# Patient Record
Sex: Female | Born: 1990 | Race: Black or African American | Hispanic: No | Marital: Single | Smoking: Former smoker
Health system: Southern US, Community
[De-identification: ages and names within clinical notes are randomized; demographics above are authoritative.]

## PROBLEM LIST (undated history)

## (undated) ENCOUNTER — Inpatient Hospital Stay (HOSPITAL_COMMUNITY): Payer: Self-pay

## (undated) DIAGNOSIS — D509 Iron deficiency anemia, unspecified: Secondary | ICD-10-CM

## (undated) DIAGNOSIS — IMO0002 Reserved for concepts with insufficient information to code with codable children: Secondary | ICD-10-CM

## (undated) DIAGNOSIS — Z8742 Personal history of other diseases of the female genital tract: Secondary | ICD-10-CM

## (undated) DIAGNOSIS — J45909 Unspecified asthma, uncomplicated: Secondary | ICD-10-CM

## (undated) DIAGNOSIS — N390 Urinary tract infection, site not specified: Secondary | ICD-10-CM

## (undated) HISTORY — PX: HERNIA REPAIR: SHX51

---
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
## 2010-01-23 ENCOUNTER — Inpatient Hospital Stay (HOSPITAL_COMMUNITY): Admission: AD | Admit: 2010-01-23 | Discharge: 2010-01-23 | Payer: Self-pay | Admitting: Obstetrics and Gynecology

## 2010-04-09 ENCOUNTER — Inpatient Hospital Stay (HOSPITAL_COMMUNITY): Admission: AD | Admit: 2010-04-09 | Discharge: 2010-04-12 | Payer: Self-pay | Admitting: Obstetrics and Gynecology

## 2010-04-10 ENCOUNTER — Encounter (INDEPENDENT_AMBULATORY_CARE_PROVIDER_SITE_OTHER): Payer: Self-pay | Admitting: Obstetrics and Gynecology

## 2010-09-26 LAB — CBC
HCT: 37.4 % (ref 36.0–46.0)
Hemoglobin: 10.3 g/dL — ABNORMAL LOW (ref 12.0–15.0)
Hemoglobin: 12.3 g/dL (ref 12.0–15.0)
MCH: 27.1 pg (ref 26.0–34.0)
MCH: 27.5 pg (ref 26.0–34.0)
MCHC: 32.9 g/dL (ref 30.0–36.0)
MCV: 82.6 fL (ref 78.0–100.0)
Platelets: 168 10*3/uL (ref 150–400)
RBC: 3.76 MIL/uL — ABNORMAL LOW (ref 3.87–5.11)
RBC: 4.53 MIL/uL (ref 3.87–5.11)
RDW: 15.6 % — ABNORMAL HIGH (ref 11.5–15.5)
WBC: 13 10*3/uL — ABNORMAL HIGH (ref 4.0–10.5)

## 2010-09-29 LAB — URINE MICROSCOPIC-ADD ON

## 2010-09-29 LAB — URINALYSIS, ROUTINE W REFLEX MICROSCOPIC
Glucose, UA: NEGATIVE mg/dL
Ketones, ur: NEGATIVE mg/dL
pH: 6 (ref 5.0–8.0)

## 2010-09-29 LAB — WET PREP, GENITAL

## 2010-09-29 LAB — FETAL FIBRONECTIN: Fetal Fibronectin: NEGATIVE

## 2010-09-29 LAB — URINE CULTURE

## 2010-10-02 LAB — POCT PREGNANCY, URINE: Preg Test, Ur: POSITIVE

## 2010-10-02 LAB — URINALYSIS, ROUTINE W REFLEX MICROSCOPIC
Glucose, UA: NEGATIVE mg/dL
Ketones, ur: 40 mg/dL — AB
Protein, ur: NEGATIVE mg/dL

## 2010-10-02 LAB — URINE CULTURE: Colony Count: >=100000

## 2010-10-02 LAB — URINE MICROSCOPIC-ADD ON

## 2010-10-27 ENCOUNTER — Emergency Department (HOSPITAL_COMMUNITY)
Admission: EM | Admit: 2010-10-27 | Discharge: 2010-10-27 | Disposition: A | Discharge status: Home / Self Care | Payer: Medicaid Other | Attending: Emergency Medicine | Admitting: Emergency Medicine

## 2010-10-27 DIAGNOSIS — J45909 Unspecified asthma, uncomplicated: Secondary | ICD-10-CM | POA: Insufficient documentation

## 2010-10-27 DIAGNOSIS — IMO0002 Reserved for concepts with insufficient information to code with codable children: Secondary | ICD-10-CM | POA: Insufficient documentation

## 2010-10-27 DIAGNOSIS — X58XXXA Exposure to other specified factors, initial encounter: Secondary | ICD-10-CM | POA: Insufficient documentation

## 2010-10-27 DIAGNOSIS — R4182 Altered mental status, unspecified: Secondary | ICD-10-CM | POA: Insufficient documentation

## 2010-10-27 DIAGNOSIS — F1010 Alcohol abuse, uncomplicated: Secondary | ICD-10-CM | POA: Insufficient documentation

## 2010-10-27 LAB — POCT I-STAT, CHEM 8
Chloride: 109 mEq/L (ref 96–112)
Creatinine, Ser: 0.9 mg/dL (ref 0.4–1.2)
Glucose, Bld: 110 mg/dL — ABNORMAL HIGH (ref 70–99)
HCT: 47 % — ABNORMAL HIGH (ref 36.0–46.0)
Potassium: 3.4 mEq/L — ABNORMAL LOW (ref 3.5–5.1)

## 2011-04-25 ENCOUNTER — Emergency Department (HOSPITAL_COMMUNITY)
Admission: EM | Admit: 2011-04-25 | Discharge: 2011-04-25 | Disposition: A | Discharge status: Home / Self Care | Payer: Medicaid Other | Attending: Emergency Medicine | Admitting: Emergency Medicine

## 2011-04-25 DIAGNOSIS — J45909 Unspecified asthma, uncomplicated: Secondary | ICD-10-CM | POA: Insufficient documentation

## 2011-04-25 DIAGNOSIS — IMO0002 Reserved for concepts with insufficient information to code with codable children: Secondary | ICD-10-CM | POA: Insufficient documentation

## 2011-04-27 LAB — CULTURE, ROUTINE-ABSCESS

## 2011-06-15 ENCOUNTER — Emergency Department (HOSPITAL_COMMUNITY)
Admission: EM | Admit: 2011-06-15 | Discharge: 2011-06-16 | Disposition: A | Discharge status: Home / Self Care | Payer: Medicaid Other | Attending: Emergency Medicine | Admitting: Emergency Medicine

## 2011-06-15 DIAGNOSIS — N390 Urinary tract infection, site not specified: Secondary | ICD-10-CM | POA: Insufficient documentation

## 2011-06-15 DIAGNOSIS — J111 Influenza due to unidentified influenza virus with other respiratory manifestations: Secondary | ICD-10-CM | POA: Insufficient documentation

## 2011-06-15 NOTE — ED Notes (Signed)
Pt reports that she has been having body aches, fever, nausea and vomiting starting today, reports that she thinks she may be pregnant

## 2011-06-16 LAB — URINALYSIS, ROUTINE W REFLEX MICROSCOPIC
Glucose, UA: NEGATIVE mg/dL
Hgb urine dipstick: NEGATIVE
Ketones, ur: 15 mg/dL — AB
Nitrite: NEGATIVE
pH: 6.5 (ref 5.0–8.0)

## 2011-06-16 LAB — PREGNANCY, URINE: Preg Test, Ur: NEGATIVE

## 2011-06-16 LAB — URINE MICROSCOPIC-ADD ON

## 2011-06-16 MED ORDER — SODIUM CHLORIDE 0.9 % IV BOLUS (SEPSIS)
1000.0000 mL | Freq: Once | INTRAVENOUS | Status: AC
  Administered 2011-06-16: 1000 mL via INTRAVENOUS

## 2011-06-16 MED ORDER — ACETAMINOPHEN 325 MG PO TABS
ORAL_TABLET | ORAL | Status: AC
  Administered 2011-06-16: 650 mg
  Filled 2011-06-16: qty 2

## 2011-06-16 MED ORDER — ONDANSETRON HCL 4 MG/2ML IJ SOLN
4.0000 mg | Freq: Once | INTRAMUSCULAR | Status: AC
  Administered 2011-06-16: 4 mg via INTRAVENOUS
  Filled 2011-06-16: qty 2

## 2011-06-16 MED ORDER — ONDANSETRON HCL 4 MG PO TABS: 4.0000 mg | ORAL_TABLET | Freq: Four times a day (QID) | ORAL | Status: AC

## 2011-06-16 MED ORDER — NITROFURANTOIN MONOHYD MACRO 100 MG PO CAPS: 100.0000 mg | ORAL_CAPSULE | Freq: Two times a day (BID) | ORAL | Status: AC

## 2011-06-16 MED ORDER — OSELTAMIVIR PHOSPHATE 75 MG PO CAPS: 75.0000 mg | ORAL_CAPSULE | Freq: Two times a day (BID) | ORAL | Status: AC

## 2011-06-16 NOTE — ED Provider Notes (Signed)
History     CSN: [id number] Arrival date & time: 06/15/2011 10:10 PM   First MD Initiated Contact with Patient 06/16/11 [phone]      Chief Complaint  Patient presents with  . Nausea    (Consider location/radiation/quality/duration/timing/severity/associated sxs/prior treatment) HPI Comments: Patient here with a day history of fever, chills, nausea without vomiting, headache, runny nose, and generalized body aches - she reports that she has had the flu shot this year already - was concerned she may be pregnant.  Patient is a 20 y.o. female presenting with fever. The history is provided by the patient. No language interpreter was used.  Fever Primary symptoms of the febrile illness include fever, fatigue, headaches, cough, nausea and myalgias. Primary symptoms do not include wheezing, shortness of breath, abdominal pain, vomiting, diarrhea, dysuria, arthralgias or rash. The current episode started yesterday. This is a new problem. The problem has not changed since onset.   Past Medical History  Diagnosis Date  . Arthritis     Past Surgical History  Procedure Date  . Hernia repair     No family history on file.  History  Substance Use Topics  . Smoking status: Former Smoker    Types: Cigarettes    Quit date: 06/14/2008  . Smokeless tobacco: Not on file  . Alcohol Use: No    OB History    Grav Para Term Preterm Abortions TAB SAB Ect Mult Living   1 1 1       1       Review of Systems  Constitutional: Positive for fever and fatigue.  Respiratory: Positive for cough. Negative for shortness of breath and wheezing.   Gastrointestinal: Positive for nausea. Negative for vomiting, abdominal pain and diarrhea.  Genitourinary: Negative for dysuria.  Musculoskeletal: Positive for myalgias. Negative for arthralgias.  Skin: Negative for rash.  Neurological: Positive for headaches.  All other systems reviewed and are negative.    Allergies  Review of patient's allergies  indicates no known allergies.  Home Medications   Current Outpatient Rx  Name Route Sig Dispense Refill  . IBUPROFEN 200 MG PO TABS Oral Take 200 mg by mouth every 6 (six) hours as needed. Cramps       BP 123/79  Pulse 102  Temp(Src) 101.6 F (38.7 C) (Oral)  Resp 20  Wt 141 lb 14.4 oz (64.365 kg)  SpO2 98%  LMP 05/16/2011  Breastfeeding? No  Physical Exam  Nursing note and vitals reviewed. Constitutional: She is oriented to person, place, and time. She appears well-developed and well-nourished. No distress.  HENT:  Head: Normocephalic and atraumatic.  Right Ear: External ear normal.  Left Ear: External ear normal.  Nose: Rhinorrhea present.  Mouth/Throat: Oropharynx is clear and moist. No oropharyngeal exudate.  Eyes: Conjunctivae are normal. Pupils are equal, round, and reactive to light.  Neck: Normal range of motion. Neck supple.  Cardiovascular: Normal rate, regular rhythm and normal heart sounds.  Exam reveals no gallop and no friction rub.   No murmur heard. Pulmonary/Chest: Effort normal and breath sounds normal. No respiratory distress. She has no wheezes. She has no rales.  Abdominal: Soft. Bowel sounds are normal. There is no tenderness.  Musculoskeletal: Normal range of motion.  Lymphadenopathy:    She has no cervical adenopathy.  Neurological: She is alert and oriented to person, place, and time.  Skin: Skin is warm and dry. No rash noted.  Psychiatric: She has a normal mood and affect. Her behavior is normal. Judgment and thought  content normal.    ED Course  Procedures (including critical care time)  Labs Reviewed  URINALYSIS, ROUTINE W REFLEX MICROSCOPIC - Abnormal; Notable for the following:    Color, Urine AMBER (*) BIOCHEMICALS MAY BE AFFECTED BY COLOR   APPearance CLOUDY (*)    Bilirubin Urine SMALL (*)    Ketones, ur 15 (*)    Protein, ur 30 (*)    All other components within normal limits  URINE MICROSCOPIC-ADD ON - Abnormal; Notable for the  following:    Squamous Epithelial / LPF FEW (*)    Bacteria, UA FEW (*)    All other components within normal limits  PREGNANCY, URINE  PREGNANCY, URINE   No results found.   UTI Influenza   MDM  Patient reports feeling better after a liter of fluids and zofran - not pregnant but with UTI and likely the flu as well.  Will start on tamiflu and give zofran - she is encouraged to take tylenol and motrin for the fever and chills.        Izola Price [place], [place] 06/16/11 [phone]

## 2011-06-16 NOTE — ED Notes (Signed)
Tolerated meal given

## 2011-06-17 NOTE — ED Provider Notes (Signed)
Medical screening examination/treatment/procedure(s) were performed by non-physician practitioner and as supervising physician I was immediately available for consultation/collaboration.  [name] R. [name], MD 06/17/11 1948 

## 2012-05-25 ENCOUNTER — Encounter (HOSPITAL_COMMUNITY): Payer: Self-pay | Admitting: Emergency Medicine

## 2012-05-25 ENCOUNTER — Emergency Department (HOSPITAL_COMMUNITY): Payer: Medicaid Other

## 2012-05-25 ENCOUNTER — Emergency Department (HOSPITAL_COMMUNITY)
Admission: EM | Admit: 2012-05-25 | Discharge: 2012-05-25 | Disposition: A | Discharge status: Home / Self Care | Payer: Medicaid Other | Attending: Emergency Medicine | Admitting: Emergency Medicine

## 2012-05-25 DIAGNOSIS — R059 Cough, unspecified: Secondary | ICD-10-CM | POA: Insufficient documentation

## 2012-05-25 DIAGNOSIS — R5381 Other malaise: Secondary | ICD-10-CM | POA: Insufficient documentation

## 2012-05-25 DIAGNOSIS — Z8739 Personal history of other diseases of the musculoskeletal system and connective tissue: Secondary | ICD-10-CM | POA: Insufficient documentation

## 2012-05-25 DIAGNOSIS — J45901 Unspecified asthma with (acute) exacerbation: Secondary | ICD-10-CM | POA: Insufficient documentation

## 2012-05-25 DIAGNOSIS — R52 Pain, unspecified: Secondary | ICD-10-CM | POA: Insufficient documentation

## 2012-05-25 DIAGNOSIS — J069 Acute upper respiratory infection, unspecified: Secondary | ICD-10-CM | POA: Insufficient documentation

## 2012-05-25 DIAGNOSIS — R509 Fever, unspecified: Secondary | ICD-10-CM | POA: Insufficient documentation

## 2012-05-25 DIAGNOSIS — R071 Chest pain on breathing: Secondary | ICD-10-CM | POA: Insufficient documentation

## 2012-05-25 DIAGNOSIS — R51 Headache: Secondary | ICD-10-CM | POA: Insufficient documentation

## 2012-05-25 DIAGNOSIS — R05 Cough: Secondary | ICD-10-CM | POA: Insufficient documentation

## 2012-05-25 DIAGNOSIS — J3489 Other specified disorders of nose and nasal sinuses: Secondary | ICD-10-CM | POA: Insufficient documentation

## 2012-05-25 DIAGNOSIS — J111 Influenza due to unidentified influenza virus with other respiratory manifestations: Secondary | ICD-10-CM

## 2012-05-25 DIAGNOSIS — Z87891 Personal history of nicotine dependence: Secondary | ICD-10-CM | POA: Insufficient documentation

## 2012-05-25 LAB — RAPID STREP SCREEN (MED CTR MEBANE ONLY): Streptococcus, Group A Screen (Direct): NEGATIVE

## 2012-05-25 MED ORDER — PREDNISONE 20 MG PO TABS: 40.0000 mg | ORAL_TABLET | Freq: Every day | ORAL | Status: DC

## 2012-05-25 MED ORDER — ALBUTEROL SULFATE HFA 108 (90 BASE) MCG/ACT IN AERS: 1.0000 | INHALATION_SPRAY | Freq: Four times a day (QID) | RESPIRATORY_TRACT | Status: DC | PRN

## 2012-05-25 MED ORDER — PREDNISONE 20 MG PO TABS
40.0000 mg | ORAL_TABLET | Freq: Once | ORAL | Status: AC
  Administered 2012-05-25: 40 mg via ORAL
  Filled 2012-05-25: qty 2

## 2012-05-25 MED ORDER — ALBUTEROL SULFATE (5 MG/ML) 0.5% IN NEBU: 5.0000 mg | INHALATION_SOLUTION | Freq: Once | RESPIRATORY_TRACT | Status: DC

## 2012-05-25 MED ORDER — ALBUTEROL SULFATE (5 MG/ML) 0.5% IN NEBU
5.0000 mg | INHALATION_SOLUTION | Freq: Once | RESPIRATORY_TRACT | Status: AC
  Administered 2012-05-25: 5 mg via RESPIRATORY_TRACT
  Filled 2012-05-25: qty 1

## 2012-05-25 MED ORDER — IPRATROPIUM BROMIDE 0.02 % IN SOLN
0.5000 mg | Freq: Once | RESPIRATORY_TRACT | Status: AC
  Administered 2012-05-25: 0.5 mg via RESPIRATORY_TRACT
  Filled 2012-05-25: qty 2.5

## 2012-05-25 NOTE — ED Notes (Signed)
Pt c/o URI sx with productive cough and green sputum and pain with cough; pt sts generalized body aches and sore throat

## 2012-05-25 NOTE — ED Provider Notes (Signed)
History     CSN: [id number]  Arrival date & time 05/25/12  1350   First MD Initiated Contact with Patient 05/25/12 1520      Chief Complaint  Patient presents with  . Sore Throat  . URI  . Generalized Body Aches    (Consider location/radiation/quality/duration/timing/severity/associated sxs/prior treatment) HPI Comments: Patient presents today with a chief complaint of generalized body aches, productive cough, ST, headache, decreased appetite and fatigue.  She reports that her symptoms have been present for the past four days and are gradually worsening.  She did have a fever of 101 orally two days ago, but no fever since then.  She has taken Tylenol for her symptoms, which she reports does help.  She has not taken any Tylenol or Ibuprofen since yesterday.  She did not have the flu vaccine this year.  PMH significant for Asthma.  She ran out of her Albuterol inhaler.  She states that she has had some wheezing and tightness in her chest over the past couple of days.  She currently does not smoke.  She quit in 2009.    The history is provided by the patient.    Past Medical History  Diagnosis Date  . Arthritis     Past Surgical History  Procedure Date  . Hernia repair     History reviewed. No pertinent family history.  History  Substance Use Topics  . Smoking status: Former Smoker    Types: Cigarettes    Quit date: 06/14/2008  . Smokeless tobacco: Not on file  . Alcohol Use: No    OB History    Grav Para Term Preterm Abortions TAB SAB Ect Mult Living   1 1 1       1       Review of Systems  Constitutional: Positive for fever, chills, appetite change and fatigue.  HENT: Positive for congestion and sore throat. Negative for ear pain, rhinorrhea, drooling, trouble swallowing, neck pain, neck stiffness, voice change and sinus pressure.   Respiratory: Positive for cough, chest tightness and wheezing.   Gastrointestinal: Negative for nausea, vomiting, abdominal pain and  diarrhea.  Genitourinary: Negative for dysuria, urgency and frequency.  Skin: Negative for rash.  Neurological: Positive for headaches.    Allergies  Review of patient's allergies indicates no known allergies.  Home Medications   Current Outpatient Rx  Name  Route  Sig  Dispense  Refill  . PSEUDOEPH-DOXYLAMINE-DM-APAP 60-7.12-10-998 MG/30ML PO LIQD   Oral   Take 30 mLs by mouth 2 (two) times daily as needed. Cold and congestion           BP 134/88  Pulse 79  Temp 98.2 F (36.8 C) (Oral)  Resp 20  SpO2 97%  LMP 04/28/2012  Physical Exam  Nursing note and vitals reviewed. Constitutional: She appears well-developed and well-nourished.  HENT:  Head: Normocephalic and atraumatic. No trismus in the jaw.  Right Ear: Tympanic membrane and ear canal normal.  Left Ear: Tympanic membrane and ear canal normal.  Nose: Mucosal edema present. No rhinorrhea. Right sinus exhibits no maxillary sinus tenderness and no frontal sinus tenderness. Left sinus exhibits no maxillary sinus tenderness and no frontal sinus tenderness.  Mouth/Throat: Uvula is midline and mucous membranes are normal. No uvula swelling. Posterior oropharyngeal erythema present. No oropharyngeal exudate.  Neck: Normal range of motion. Neck supple.  Cardiovascular: Normal rate, regular rhythm and normal heart sounds.   Pulmonary/Chest: No accessory muscle usage. Not tachypneic. She has decreased breath sounds. She  has wheezes. She exhibits tenderness.       Patient able to speak in full sentences Patient with diffuse expiratory and inspiratory wheezes.  Neurological: She is alert.  Skin: Skin is warm and dry. No rash noted.  Psychiatric: She has a normal mood and affect.    ED Course  Procedures (including critical care time)   Labs Reviewed  RAPID STREP SCREEN   Dg Chest 2 View  05/25/2012  *RADIOLOGY REPORT*  Clinical Data: Cough and chest tightness  CHEST - 2 VIEW  Comparison: February 29, 2008  Findings:   Lungs clear.  Heart size and pulmonary vascularity are normal.  No adenopathy.  No bone lesions.  IMPRESSION: Lungs clear.   Original Report Authenticated By: Bretta Bang, M.D.      No diagnosis found.  4:14 PM Reassessed patient.  Lungs sounds have improved.  However, patient feels that she would benefit from one more breathing treatment.  4:54 PM Patient states that she no longer feels that she needs the second breathing treatment.  Lungs CTAB. MDM  Patient with symptoms consistent with influenza.  Vitals are stable.  No signs of dehydration, tolerating PO's.  CXR and rapid strep negative.  Discussed the cost versus benefit of Tamiflu treatment with the patient.  The patient understands that symptoms are greater than the recommended 24-48 hour window of treatment.  Patient will be discharged with instructions to orally hydrate, rest, and use over-the-counter medications such as anti-inflammatories ibuprofen and Aleve for muscle aches and Tylenol for fever.  Patient with a history of Asthma also complaining of wheezing.  She does not have an inhaler at home.  On exam initially she did have some expiratory wheezing.  Wheezing improved after one breathing treatment.  Patient also discharged home with Albuterol inhaler.          Pascal Lux Dandridge, PA-C 05/26/12 [phone]

## 2012-05-26 NOTE — ED Provider Notes (Signed)
Medical screening examination/treatment/procedure(s) were performed by non-physician practitioner and as supervising physician I was immediately available for consultation/collaboration.   Laray Anger, DO 05/26/12 1400

## 2012-07-14 NOTE — L&D Delivery Note (Signed)
Delivery Note At 5:05 AM a viable and healthy female was delivered via Vaginal, Spontaneous Delivery (Presentation: ; Occiput Anterior).  APGAR: 8, 9; weight P.   Placenta status: Intact, Spontaneous.  Cord: 3 vessels with the following complications: None.    Anesthesia: Epidural  Episiotomy: None Lacerations: None Suture Repair: N/A Est. Blood Loss (mL):   Mom to postpartum.  Baby to Couplet care / Skin to Skin.  BOVARD,Martavious Hartel 06/06/2013, 5:34 AM  Bo/A+/Nexplanon

## 2012-09-23 ENCOUNTER — Encounter (HOSPITAL_COMMUNITY): Payer: Self-pay | Admitting: Emergency Medicine

## 2012-09-23 ENCOUNTER — Emergency Department (HOSPITAL_COMMUNITY)
Admission: EM | Admit: 2012-09-23 | Discharge: 2012-09-23 | Disposition: A | Discharge status: Home / Self Care | Payer: Medicaid Other | Attending: Emergency Medicine | Admitting: Emergency Medicine

## 2012-09-23 DIAGNOSIS — Z87891 Personal history of nicotine dependence: Secondary | ICD-10-CM | POA: Insufficient documentation

## 2012-09-23 DIAGNOSIS — R300 Dysuria: Secondary | ICD-10-CM | POA: Insufficient documentation

## 2012-09-23 DIAGNOSIS — Z8739 Personal history of other diseases of the musculoskeletal system and connective tissue: Secondary | ICD-10-CM | POA: Insufficient documentation

## 2012-09-23 DIAGNOSIS — B3731 Acute candidiasis of vulva and vagina: Secondary | ICD-10-CM | POA: Insufficient documentation

## 2012-09-23 DIAGNOSIS — Z3202 Encounter for pregnancy test, result negative: Secondary | ICD-10-CM | POA: Insufficient documentation

## 2012-09-23 DIAGNOSIS — J45909 Unspecified asthma, uncomplicated: Secondary | ICD-10-CM | POA: Insufficient documentation

## 2012-09-23 DIAGNOSIS — B373 Candidiasis of vulva and vagina: Secondary | ICD-10-CM | POA: Insufficient documentation

## 2012-09-23 DIAGNOSIS — Z79899 Other long term (current) drug therapy: Secondary | ICD-10-CM | POA: Insufficient documentation

## 2012-09-23 HISTORY — DX: Unspecified asthma, uncomplicated: J45.909

## 2012-09-23 LAB — URINALYSIS, ROUTINE W REFLEX MICROSCOPIC
Glucose, UA: NEGATIVE mg/dL
Ketones, ur: NEGATIVE mg/dL
Nitrite: NEGATIVE
Protein, ur: NEGATIVE mg/dL
Urobilinogen, UA: 1 mg/dL (ref 0.0–1.0)

## 2012-09-23 LAB — POCT PREGNANCY, URINE: Preg Test, Ur: NEGATIVE

## 2012-09-23 MED ORDER — NYSTATIN 100000 UNIT/GM EX CREA: TOPICAL_CREAM | CUTANEOUS | Status: DC

## 2012-09-23 MED ORDER — FLUCONAZOLE 100 MG PO TABS
150.0000 mg | ORAL_TABLET | Freq: Once | ORAL | Status: AC
  Administered 2012-09-23: 150 mg via ORAL
  Filled 2012-09-23: qty 2

## 2012-09-23 MED ORDER — CEPHALEXIN 500 MG PO CAPS: 500.0000 mg | ORAL_CAPSULE | Freq: Four times a day (QID) | ORAL | Status: DC

## 2012-09-23 NOTE — ED Notes (Signed)
PT. REPORTS VAGINAL ITCHING Gl[place] Herring " BURNING" WITH DISCHARGE FOR SEVERAL DAYS .

## 2012-09-23 NOTE — ED Notes (Addendum)
Pt states that for the past 3-4 days she has been experiencing vaginal itching.  Pt states that symptoms started after she bought new panties from [place].  Pt states that has been a "pus like" discharge. Last menstrual cycle was 2/2.  Last intercourse around a month ago.

## 2012-09-23 NOTE — ED Provider Notes (Signed)
History     CSN: [id number]  Arrival date & time 09/23/12  0141   First MD Initiated Contact with Patient 09/23/12 0448      Chief Complaint  Patient presents with  . Vaginal Itching    (Consider location/radiation/quality/duration/timing/severity/associated sxs/prior treatment) HPI Hx per PT - vaginal itching and burning  X 2 days, is not diabetic, no ABD pain, c/o associated dysuria, no back pain, fevers, or N/V.  No rash or lesions or swelling. No h/o sign. Mild to Mod in severity.  Past Medical History  Diagnosis Date  . Arthritis   . Asthma     Past Surgical History  Procedure Laterality Date  . Hernia repair      No family history on file.  History  Substance Use Topics  . Smoking status: Former Smoker    Types: Cigarettes    Quit date: 06/14/2008  . Smokeless tobacco: Not on file  . Alcohol Use: No    OB History   Grav Para Term Preterm Abortions TAB SAB Ect Mult Living   1 1 1       1       Review of Systems  Constitutional: Negative for fever and chills.  HENT: Negative for neck pain and neck stiffness.   Eyes: Negative for pain.  Respiratory: Negative for shortness of breath.   Cardiovascular: Negative for chest pain.  Gastrointestinal: Negative for abdominal pain.  Genitourinary: Positive for dysuria. Negative for hematuria, flank pain, vaginal bleeding, vaginal discharge, difficulty urinating, genital sores and pelvic pain.  Musculoskeletal: Negative for back pain.  Skin: Negative for rash.  Neurological: Negative for headaches.  All other systems reviewed and are negative.    Allergies  Review of patient's allergies indicates no known allergies.  Home Medications   Current Outpatient Rx  Name  Route  Sig  Dispense  Refill  . albuterol (PROVENTIL HFA;VENTOLIN HFA) 108 (90 BASE) MCG/ACT inhaler   Inhalation   Inhale 1-2 puffs into the lungs every 6 (six) hours as needed for wheezing.   1 Inhaler   0   . cephALEXin (KEFLEX) 500 MG  capsule   Oral   Take 1 capsule (500 mg total) by mouth 4 (four) times daily.   20 capsule   0   . nystatin cream (MYCOSTATIN)      Apply to affected area 2 times daily   15 g   0   . predniSONE (DELTASONE) 20 MG tablet   Oral   Take 2 tablets (40 mg total) by mouth daily.   8 tablet   0   . Pseudoeph-Doxylamine-DM-APAP (NYQUIL) 60-7.12-10-998 MG/30ML LIQD   Oral   Take 30 mLs by mouth 2 (two) times daily as needed. Cold and congestion           BP 117/82  Pulse 75  Temp(Src) 98 F (36.7 C) (Oral)  Resp 14  SpO2 98%  LMP 08/15/2012  Physical Exam  Constitutional: She is oriented to person, place, and time. She appears well-developed and well-nourished.  HENT:  Head: Normocephalic and atraumatic.  Mouth/Throat: Oropharynx is clear and moist. No oropharyngeal exudate.  Eyes: EOM are normal. Pupils are equal, round, and reactive to light.  Neck: Neck supple.  Cardiovascular: Regular rhythm and intact distal pulses.   Pulmonary/Chest: Effort normal and breath sounds normal. No respiratory distress.  Abdominal: Soft. Bowel sounds are normal. She exhibits no distension.  No CVAT  Genitourinary:  Ext GU WNL - no swelling, erythema or rash/ lesions  Musculoskeletal: Normal range of motion. She exhibits no edema.  Neurological: She is alert and oriented to person, place, and time.  Skin: Skin is warm and dry. No rash noted.    ED Course  Procedures (including critical care time)   1. Yeast infection of the vagina     MDM  Clinical yeast infection - no herpetic lesions.  No pelvic pain, discharge or rash.    Ua reviewed and U Cx pending.  Referral to Health dept for f/u - no PCP        Sunnie Nielsen, MD 09/23/12 [phone]

## 2012-09-24 LAB — URINE CULTURE
Colony Count: NO GROWTH
Culture: NO GROWTH

## 2012-10-08 ENCOUNTER — Encounter (HOSPITAL_COMMUNITY): Payer: Self-pay | Admitting: Family

## 2012-10-08 ENCOUNTER — Inpatient Hospital Stay (HOSPITAL_COMMUNITY)
Admission: AD | Admit: 2012-10-08 | Discharge: 2012-10-08 | Disposition: A | Discharge status: Home / Self Care | Payer: Medicaid Other | Source: Ambulatory Visit | Attending: Obstetrics & Gynecology | Admitting: Obstetrics & Gynecology

## 2012-10-08 DIAGNOSIS — O210 Mild hyperemesis gravidarum: Secondary | ICD-10-CM

## 2012-10-08 LAB — URINALYSIS, ROUTINE W REFLEX MICROSCOPIC
Bilirubin Urine: NEGATIVE
Ketones, ur: NEGATIVE mg/dL
Leukocytes, UA: NEGATIVE
Nitrite: NEGATIVE
Protein, ur: NEGATIVE mg/dL

## 2012-10-08 LAB — URINE MICROSCOPIC-ADD ON

## 2012-10-08 MED ORDER — PROMETHAZINE HCL 25 MG PO TABS: 25.0000 mg | ORAL_TABLET | Freq: Four times a day (QID) | ORAL | Status: DC | PRN

## 2012-10-08 MED ORDER — ONDANSETRON 8 MG PO TBDP
8.0000 mg | ORAL_TABLET | Freq: Once | ORAL | Status: AC
  Administered 2012-10-08: 8 mg via ORAL
  Filled 2012-10-08: qty 1

## 2012-10-08 MED ORDER — ONDANSETRON 8 MG PO TBDP: 8.0000 mg | ORAL_TABLET | Freq: Three times a day (TID) | ORAL | Status: DC | PRN

## 2012-10-08 NOTE — MAU Note (Signed)
Name and DOB verified, pt confirms spelling is correct on ID.

## 2012-10-08 NOTE — MAU Note (Signed)
Pos HPT about a wk ago.  Hasn't been feeling well, sick a lot.

## 2012-10-08 NOTE — MAU Provider Note (Signed)
History     CSN: [id number]  Arrival date and time: 10/08/12 1352   First Provider Initiated Contact with Patient 10/08/12 1446      Chief Complaint  Patient presents with  . Emesis  . Possible Pregnancy   HPI  Pt is [redacted]w[redacted]d pregnant and presents with nausea and no appetite.  Pt denies vomiting today, spotting, bleeding or cramping.  Pt's Medicaid is in process and plans to go to [facility] for care.  Pt had nausea with her last pregnancy and used zofran with satisfaction.  Past Medical History  Diagnosis Date  . Asthma     Past Surgical History  Procedure Laterality Date  . Hernia repair      History reviewed. No pertinent family history.  History  Substance Use Topics  . Smoking status: Former Smoker    Types: Cigarettes    Quit date: 06/14/2008  . Smokeless tobacco: Not on file  . Alcohol Use: No    Allergies: No Known Allergies  Prescriptions prior to admission  Medication Sig Dispense Refill  . albuterol (PROVENTIL HFA;VENTOLIN HFA) 108 (90 BASE) MCG/ACT inhaler Inhale 1-2 puffs into the lungs every 6 (six) hours as needed for wheezing.  1 Inhaler  0    Review of Systems  Constitutional: Negative for fever and chills.  Gastrointestinal: Positive for nausea. Negative for vomiting, abdominal pain, diarrhea and constipation.   Physical Exam   Blood pressure 120/77, pulse 70, temperature 98.4 F (36.9 C), temperature source Oral, resp. rate 16, height 5\' 2"  (1.575 m), weight 148 lb (67.132 kg), last menstrual period 08/15/2012.  Physical Exam  Nursing note and vitals reviewed. Constitutional: She is oriented to person, place, and time. She appears well-developed and well-nourished. No distress.  HENT:  Head: Normocephalic.  Eyes: Pupils are equal, round, and reactive to light.  Neck: Normal range of motion. Neck supple.  Cardiovascular: Normal rate.   Respiratory: Effort normal.  GI: Soft.  Musculoskeletal: Normal range of motion.  Neurological: She is  alert and oriented to person, place, and time.  Skin: Skin is warm and dry.  Psychiatric: She has a normal mood and affect.    MAU Course  Procedures Zofran 8mg  ODT given in MAU- pt able to tolerate PO fluids and crackers Results for orders placed during the hospital encounter of 10/08/12 (from the past 24 hour(s))  URINALYSIS, ROUTINE W REFLEX MICROSCOPIC     Status: Abnormal   Collection Time    10/08/12  2:20 PM      Result Value Range   Color, Urine YELLOW  YELLOW   APPearance CLEAR  CLEAR   Specific Gravity, Urine 1.020  1.005 - 1.030   pH 6.0  5.0 - 8.0   Glucose, UA NEGATIVE  NEGATIVE mg/dL   Hgb urine dipstick SMALL (*) NEGATIVE   Bilirubin Urine NEGATIVE  NEGATIVE   Ketones, ur NEGATIVE  NEGATIVE mg/dL   Protein, ur NEGATIVE  NEGATIVE mg/dL   Urobilinogen, UA 1.0  0.0 - 1.0 mg/dL   Nitrite NEGATIVE  NEGATIVE   Leukocytes, UA NEGATIVE  NEGATIVE  URINE MICROSCOPIC-ADD ON     Status: Abnormal   Collection Time    10/08/12  2:20 PM      Result Value Range   Squamous Epithelial / LPF FEW (*) RARE   WBC, UA 3-6  <3 WBC/hpf   RBC / HPF 0-2  <3 RBC/hpf   Bacteria, UA RARE  RARE   Urine-Other MUCOUS PRESENT    POCT  PREGNANCY, URINE     Status: Abnormal   Collection Time    10/08/12  2:31 PM      Result Value Range   Preg Test, Ur POSITIVE (*) NEGATIVE   Assessment and Plan  Morning sickness- prescription for Zofran and phenergan F/u for OB care  Tammy Torres 10/08/2012, 2:53 PM

## 2012-10-08 NOTE — MAU Note (Signed)
Patient presents to MAU with c/o N/V x 3 days; reports +HPT one week ago. Reports she is able to tolerate  PO food and fluids, just does not feel like eating.  Denies vaginal bleeding.

## 2012-11-08 LAB — OB RESULTS CONSOLE HIV ANTIBODY (ROUTINE TESTING): HIV: NONREACTIVE

## 2012-11-08 LAB — OB RESULTS CONSOLE HEPATITIS B SURFACE ANTIGEN: Hepatitis B Surface Ag: NEGATIVE

## 2012-11-08 LAB — OB RESULTS CONSOLE RUBELLA ANTIBODY, IGM: Rubella: NON-IMMUNE/NOT IMMUNE

## 2012-11-08 LAB — OB RESULTS CONSOLE RPR: RPR: NONREACTIVE

## 2012-11-08 LAB — OB RESULTS CONSOLE GC/CHLAMYDIA: Chlamydia: NEGATIVE

## 2012-11-08 LAB — OB RESULTS CONSOLE ANTIBODY SCREEN: Antibody Screen: NEGATIVE

## 2012-11-09 ENCOUNTER — Inpatient Hospital Stay (HOSPITAL_COMMUNITY)
Admission: AD | Admit: 2012-11-09 | Discharge: 2012-11-10 | Disposition: A | Discharge status: Home / Self Care | Payer: Medicaid Other | Source: Ambulatory Visit | Attending: Obstetrics and Gynecology | Admitting: Obstetrics and Gynecology

## 2012-11-09 ENCOUNTER — Encounter (HOSPITAL_COMMUNITY): Payer: Self-pay | Admitting: *Deleted

## 2012-11-09 DIAGNOSIS — O219 Vomiting of pregnancy, unspecified: Secondary | ICD-10-CM

## 2012-11-09 DIAGNOSIS — O210 Mild hyperemesis gravidarum: Secondary | ICD-10-CM | POA: Insufficient documentation

## 2012-11-09 NOTE — MAU Note (Signed)
Vomiting 7-8 times per day

## 2012-11-09 NOTE — MAU Note (Signed)
Pt. Has been vomiting everyday 7- 8 times a day for the last 2 weeks.  Dr. Prescribed new medication that pt. States make it worse.

## 2012-11-10 DIAGNOSIS — O210 Mild hyperemesis gravidarum: Secondary | ICD-10-CM

## 2012-11-10 MED ORDER — ONDANSETRON 8 MG PO TBDP
8.0000 mg | ORAL_TABLET | Freq: Once | ORAL | Status: AC
  Administered 2012-11-10: 8 mg via ORAL
  Filled 2012-11-10: qty 1

## 2012-11-10 MED ORDER — ONDANSETRON 8 MG PO TBDP: 8.0000 mg | ORAL_TABLET | Freq: Three times a day (TID) | ORAL | Status: DC | PRN

## 2012-11-10 MED ORDER — PROMETHAZINE HCL 25 MG PO TABS: 25.0000 mg | ORAL_TABLET | Freq: Four times a day (QID) | ORAL | Status: DC | PRN

## 2012-11-10 MED ORDER — PROMETHAZINE HCL 25 MG/ML IJ SOLN
25.0000 mg | Freq: Once | INTRAVENOUS | Status: AC
  Administered 2012-11-10: 25 mg via INTRAVENOUS
  Filled 2012-11-10: qty 1

## 2012-11-10 NOTE — MAU Provider Note (Signed)
Chief Complaint: Hyperemesis Gravidarum  First Provider Initiated Contact with Patient 11/10/12 0058     SUBJECTIVE HPI: Tammy Torres is a 22 y.o. G2P1001 at [redacted]w[redacted]d by LMP who presents with vomiting 7-8 x per day since early pregnancy. Rx'd ? Diclegis, not working. Was E-Rxd other meds (?phenergan and Zofran), but they were not at the pharmacy when she went to pick them up. Denies fever, chills, diarrhea, sick contacts.   Past Medical History  Diagnosis Date  . Asthma    OB History   Grav Para Term Preterm Abortions TAB SAB Ect Mult Living   2 1 1       1      # Outc Date GA Lbr Len/2nd Wgt Sex Del Anes PTL Lv   1 TRM 9/11   1.610RU([id number]) F SVD  No Yes   2 CUR              Past Surgical History  Procedure Laterality Date  . Hernia repair     History   Social History  . Marital Status: Single    Spouse Name: N/A    Number of Children: N/A  . Years of Education: N/A   Occupational History  . Not on file.   Social History Main Topics  . Smoking status: Former Smoker    Types: Cigarettes    Quit date: 06/14/2008  . Smokeless tobacco: Not on file  . Alcohol Use: No  . Drug Use: No  . Sexually Active: Yes   Other Topics Concern  . Not on file   Social History Narrative  . No narrative on file   No current facility-administered medications on file prior to encounter.   Current Outpatient Prescriptions on File Prior to Encounter  Medication Sig Dispense Refill  . albuterol (PROVENTIL HFA;VENTOLIN HFA) 108 (90 BASE) MCG/ACT inhaler Inhale 1-2 puffs into the lungs every 6 (six) hours as needed for wheezing.  1 Inhaler  0   No Known Allergies  ROS: Pertinent items in HPI  OBJECTIVE Blood pressure 123/77, pulse 92, resp. rate 20, height 5\' 3"  (1.6 m), weight 65.772 kg (145 lb), last menstrual period 08/15/2012, SpO2 100.00%. GENERAL: Well-developed, well-nourished female in no mild distress. Actively vomiting bile. HEENT: Normocephalic. Mucus membranes dry.   HEART: normal rate RESP: normal effort ABDOMEN: Soft, non-tender EXTREMITIES: Nontender, no edema NEURO: Alert and oriented  LAB RESULTS No results found for this or any previous visit (from the past 24 hour(s)).  IMAGING No results found.  MAU COURSE Pt actively vomiting. States she cannot urinate. Phenergan IV ordered. Feeling much better after 1 liter fluids. Tolerating POs after phenergan and Zofran.   ASSESSMENT 1. Nausea and vomiting in pregnancy prior to [redacted] weeks gestation    PLAN Discharge home. Advance diet slowly.     Follow-up Information   Follow up with BOVARD,JODY, MD. (as scheduled)    Contact information:   [address] [place] [address] [phone]       Follow up with [facility]ERN[facility] ADMISS[facility]. (As needed if symptoms worsen)    Contact information:   [address] [place] [address] [phone]       Medication List    TAKE these medications       albuterol 108 (90 BASE) MCG/ACT inhaler  Commonly known as:  PROVENTIL HFA;VENTOLIN HFA  Inhale 1-2 puffs into the lungs every 6 (six) hours as needed for wheezing.     ondansetron 8  MG disintegrating tablet  Commonly known as:  ZOFRAN ODT  Take 1 tablet (8 mg total) by mouth every 8 (eight) hours as needed for nausea.     promethazine 25 MG tablet  Commonly known as:  PHENERGAN  Take 1 tablet (25 mg total) by mouth every 6 (six) hours as needed for nausea.       [place], CNM 11/10/2012  2:24 AM

## 2012-11-29 ENCOUNTER — Inpatient Hospital Stay (HOSPITAL_COMMUNITY)
Admission: AD | Admit: 2012-11-29 | Discharge: 2012-11-29 | Disposition: A | Discharge status: Home / Self Care | Payer: Medicaid Other | Source: Ambulatory Visit | Attending: Obstetrics and Gynecology | Admitting: Obstetrics and Gynecology

## 2012-11-29 ENCOUNTER — Encounter (HOSPITAL_COMMUNITY): Payer: Self-pay | Admitting: *Deleted

## 2012-11-29 DIAGNOSIS — O210 Mild hyperemesis gravidarum: Secondary | ICD-10-CM

## 2012-11-29 DIAGNOSIS — O219 Vomiting of pregnancy, unspecified: Secondary | ICD-10-CM

## 2012-11-29 LAB — WET PREP, GENITAL
Clue Cells Wet Prep HPF POC: NONE SEEN
Yeast Wet Prep HPF POC: NONE SEEN

## 2012-11-29 LAB — URINALYSIS, ROUTINE W REFLEX MICROSCOPIC
Glucose, UA: NEGATIVE mg/dL
Nitrite: NEGATIVE
pH: 7 (ref 5.0–8.0)

## 2012-11-29 LAB — URINE MICROSCOPIC-ADD ON

## 2012-11-29 MED ORDER — DEXTROSE IN LACTATED RINGERS 5 % IV SOLN
INTRAVENOUS | Status: AC
  Administered 2012-11-29: 16:00:00 via INTRAVENOUS

## 2012-11-29 MED ORDER — ONDANSETRON 8 MG PO TBDP
8.0000 mg | ORAL_TABLET | Freq: Once | ORAL | Status: AC
  Administered 2012-11-29: 8 mg via ORAL
  Filled 2012-11-29: qty 1

## 2012-11-29 MED ORDER — ONDANSETRON 8 MG PO TBDP: 8.0000 mg | ORAL_TABLET | Freq: Three times a day (TID) | ORAL | Status: DC | PRN

## 2012-11-29 MED ORDER — DOXYLAMINE-PYRIDOXINE 10-10 MG PO TBEC: 2.0000 | DELAYED_RELEASE_TABLET | Freq: Every day | ORAL | Status: DC

## 2012-11-29 MED ORDER — LACTATED RINGERS IV BOLUS (SEPSIS)
1000.0000 mL | Freq: Once | INTRAVENOUS | Status: AC
  Administered 2012-11-29: 1000 mL via INTRAVENOUS

## 2012-11-29 MED ORDER — PROMETHAZINE HCL 12.5 MG PO TABS: 12.5000 mg | ORAL_TABLET | Freq: Four times a day (QID) | ORAL | Status: DC | PRN

## 2012-11-29 NOTE — MAU Note (Signed)
Pt states she has been vomiting x 3 weeks and hasn't been able keep anything down for 3 days.  Pt is out of zofran.

## 2012-11-29 NOTE — MAU Provider Note (Signed)
History     CSN: [id number]  Arrival date and time: 11/29/12 1422   First Provider Initiated Contact with Patient 11/29/12 1525      Chief Complaint  Patient presents with  . Emesis During Pregnancy   HPI  Tammy Torres is a 22 y.o. G2P1001 at [redacted]w[redacted]d who presents with nausea and vomiting. She states that she has not kept anything down for 3 days. She is unsure if she has lost weight. She does not have a scale at home. She states that she vomits at least 10x per day. She denies any fever or diarrhea.   She is also having a white, itchy vaginal discharge.   Past Medical History  Diagnosis Date  . Asthma     Past Surgical History  Procedure Laterality Date  . Hernia repair      History reviewed. No pertinent family history.  History  Substance Use Topics  . Smoking status: Former Smoker    Types: Cigarettes    Quit date: 06/14/2008  . Smokeless tobacco: Not on file  . Alcohol Use: No    Allergies: No Known Allergies  Prescriptions prior to admission  Medication Sig Dispense Refill  . albuterol (PROVENTIL HFA;VENTOLIN HFA) 108 (90 BASE) MCG/ACT inhaler Inhale 2 puffs into the lungs every 4 (four) hours as needed for wheezing or shortness of breath.      . ondansetron (ZOFRAN ODT) 8 MG disintegrating tablet Take 1 tablet (8 mg total) by mouth every 8 (eight) hours as needed for nausea.  20 tablet  1    Review of Systems  Constitutional: Negative for fever.  Eyes: Negative for blurred vision.  Respiratory: Negative for shortness of breath.   Cardiovascular: Negative for chest pain.  Gastrointestinal: Positive for nausea and vomiting. Negative for abdominal pain, diarrhea and constipation.  Genitourinary: Negative for dysuria, urgency and frequency.  Musculoskeletal: Negative for myalgias.  Neurological: Negative for dizziness and headaches.   Physical Exam   Blood pressure 124/73, pulse 87, temperature 98.1 F (36.7 C), temperature source Oral, resp. rate 18,  height 5\' 3"  (1.6 m), last menstrual period 08/15/2012, SpO2 100.00%.  Physical Exam  Nursing note and vitals reviewed. Constitutional: She is oriented to person, place, and time. She appears well-developed and well-nourished. No distress.  Cardiovascular: Normal rate.   Respiratory: Effort normal.  GI: Soft. She exhibits no distension. There is no tenderness.  Genitourinary:   External: no lesion Vagina: small amount of white discharge Cervix: pink, parous, smooth Uterus: AGA  Neurological: She is alert and oriented to person, place, and time.  Skin: Skin is warm and dry.  Psychiatric: She has a normal mood and affect.   11/09/12 weight: 145lbs,  11/29/12 weight 134.8 lbs MAU Course  Procedures  Results for orders placed during the hospital encounter of 11/29/12 (from the past 24 hour(s))  URINALYSIS, ROUTINE W REFLEX MICROSCOPIC     Status: Abnormal   Collection Time    11/29/12  3:00 PM      Result Value Range   Color, Urine ORANGE (*) YELLOW   APPearance HAZY (*) CLEAR   Specific Gravity, Urine 1.025  1.005 - 1.030   pH 7.0  5.0 - 8.0   Glucose, UA NEGATIVE  NEGATIVE mg/dL   Hgb urine dipstick SMALL (*) NEGATIVE   Bilirubin Urine SMALL (*) NEGATIVE   Ketones, ur >80 (*) NEGATIVE mg/dL   Protein, ur NEGATIVE  NEGATIVE mg/dL   Urobilinogen, UA 1.0  0.0 - 1.0 mg/dL  Nitrite NEGATIVE  NEGATIVE   Leukocytes, UA TRACE (*) NEGATIVE  URINE MICROSCOPIC-ADD ON     Status: Abnormal   Collection Time    11/29/12  3:00 PM      Result Value Range   Squamous Epithelial / LPF FEW (*) RARE   WBC, UA 3-6  <3 WBC/hpf   RBC / HPF 3-6  <3 RBC/hpf   Bacteria, UA FEW (*) RARE   Urine-Other MUCOUS PRESENT    WET PREP, GENITAL     Status: Abnormal   Collection Time    11/29/12  3:30 PM      Result Value Range   Yeast Wet Prep HPF POC NONE SEEN  NONE SEEN   Trich, Wet Prep NONE SEEN  NONE SEEN   Clue Cells Wet Prep HPF POC NONE SEEN  NONE SEEN   WBC, Wet Prep HPF POC FEW (*) NONE  SEEN    17:28: Spoke with Dr. Ambrose Mantle, continue to treat symptoms and refill meds. Have her FU as scheduled in the office Assessment and Plan   1. Nausea and vomiting in pregnancy prior to [redacted] weeks gestation    RX: Zofran, phenergan and Diclegis  FU as scheduled with the office.  [name] 11/29/2012, 3:32 PM

## 2012-11-30 LAB — URINE CULTURE: Colony Count: 3000

## 2012-12-01 ENCOUNTER — Inpatient Hospital Stay (HOSPITAL_COMMUNITY)
Admission: AD | Admit: 2012-12-01 | Discharge: 2012-12-01 | Disposition: A | Discharge status: Home / Self Care | Payer: Medicaid Other | Source: Ambulatory Visit | Attending: Obstetrics and Gynecology | Admitting: Obstetrics and Gynecology

## 2012-12-01 DIAGNOSIS — O219 Vomiting of pregnancy, unspecified: Secondary | ICD-10-CM

## 2012-12-01 DIAGNOSIS — O210 Mild hyperemesis gravidarum: Secondary | ICD-10-CM | POA: Insufficient documentation

## 2012-12-01 LAB — COMPREHENSIVE METABOLIC PANEL
ALT: 7 U/L (ref 0–35)
AST: 15 U/L (ref 0–37)
Alkaline Phosphatase: 50 U/L (ref 39–117)
CO2: 21 mEq/L (ref 19–32)
Calcium: 9.6 mg/dL (ref 8.4–10.5)
Chloride: 101 mEq/L (ref 96–112)
GFR calc Af Amer: >90 mL/min (ref >90)
GFR calc non Af Amer: >90 mL/min (ref >90)
Glucose, Bld: 98 mg/dL (ref 70–99)
Potassium: 3.1 mEq/L — ABNORMAL LOW (ref 3.5–5.1)
Sodium: 136 mEq/L (ref 135–145)
Total Bilirubin: 0.4 mg/dL (ref 0.3–1.2)

## 2012-12-01 LAB — URINALYSIS, ROUTINE W REFLEX MICROSCOPIC
Glucose, UA: NEGATIVE mg/dL
Protein, ur: 30 mg/dL — AB
pH: 6 (ref 5.0–8.0)

## 2012-12-01 MED ORDER — DEXTROSE 5 % IN LACTATED RINGERS IV BOLUS: 1000.0000 mL | Freq: Once | INTRAVENOUS | Status: DC

## 2012-12-01 MED ORDER — ONDANSETRON 8 MG PO TBDP
8.0000 mg | ORAL_TABLET | Freq: Once | ORAL | Status: AC
  Administered 2012-12-01: 8 mg via ORAL
  Filled 2012-12-01: qty 1

## 2012-12-01 MED ORDER — PANTOPRAZOLE SODIUM 40 MG IV SOLR
40.0000 mg | Freq: Once | INTRAVENOUS | Status: DC
  Filled 2012-12-01: qty 40

## 2012-12-01 MED ORDER — PROMETHAZINE HCL 25 MG PO TABS: 25.0000 mg | ORAL_TABLET | Freq: Once | ORAL | Status: DC

## 2012-12-01 MED ORDER — ONDANSETRON 8 MG PO TBDP: 8.0000 mg | ORAL_TABLET | Freq: Three times a day (TID) | ORAL | Status: DC | PRN

## 2012-12-01 MED ORDER — PROMETHAZINE HCL 25 MG/ML IJ SOLN
25.0000 mg | Freq: Once | INTRAMUSCULAR | Status: AC
  Administered 2012-12-01: 25 mg via INTRAMUSCULAR
  Filled 2012-12-01: qty 1

## 2012-12-01 NOTE — MAU Note (Addendum)
IV attempted x2 by 3 different RN's. Unable to gain IV access. Zorita Pang CNM made aware.  Pt has not vomited since taking Zofran. Pt drinking spirte. Encouraged po intake.

## 2012-12-01 NOTE — MAU Note (Signed)
Pt was seen yesterday in MAU for n/v. Vomiting is persisting. Pt has not taken anything for nausea since she was in MAU. Pt states that the only pill that works is the "one that dissolves". Pt says that the other pills do not help.

## 2012-12-01 NOTE — MAU Provider Note (Signed)
History     CSN: [id number]  Arrival date and time: 12/01/12 0021   None     No chief complaint on file.  HPI  Tammy Torres is a 22 y.o. G2P1001 at [redacted]w[redacted]d who presents tonight with worsening nausea and vomiting. She was seen here in MAU on 11/29/12 and she did not get her prescriptions filled. Pt states that she "wants to stay in the hospital for her whole pregnancy". Pt would like to have a RX for "dissolvable nausea medicine".   11/09/12 weight: 145lbs 11/29/12 weight 134.8 lbs 12/01/12 weight 135 lbs  Past Medical History  Diagnosis Date  . Asthma     Past Surgical History  Procedure Laterality Date  . Hernia repair      No family history on file.  History  Substance Use Topics  . Smoking status: Former Smoker    Types: Cigarettes    Quit date: 06/14/2008  . Smokeless tobacco: Not on file  . Alcohol Use: No    Allergies: No Known Allergies  Prescriptions prior to admission  Medication Sig Dispense Refill  . albuterol (PROVENTIL HFA;VENTOLIN HFA) 108 (90 BASE) MCG/ACT inhaler Inhale 2 puffs into the lungs every 4 (four) hours as needed for wheezing or shortness of breath.      . Doxylamine-Pyridoxine (DICLEGIS) 10-10 MG TBEC Take 2 tablets by mouth at bedtime. If 2 tablets do not help, may increase to a maximum of 4 tablets daily.  120 tablet  1  . ondansetron (ZOFRAN ODT) 8 MG disintegrating tablet Take 1 tablet (8 mg total) by mouth every 8 (eight) hours as needed for nausea.  20 tablet  1  . ondansetron (ZOFRAN ODT) 8 MG disintegrating tablet Take 1 tablet (8 mg total) by mouth every 8 (eight) hours as needed for nausea.  30 tablet  1  . promethazine (PHENERGAN) 12.5 MG tablet Take 1 tablet (12.5 mg total) by mouth every 6 (six) hours as needed for nausea.  30 tablet  1    ROS Physical Exam   Blood pressure 117/77, pulse 91, temperature 97.5 F (36.4 C), temperature source Oral, resp. rate 22, weight 61.598 kg (135 lb 12.8 oz), last menstrual period  08/15/2012.  Physical Exam  MAU Course  Procedures  Results for orders placed during the hospital encounter of 12/01/12 (from the past 24 hour(s))  COMPREHENSIVE METABOLIC PANEL     Status: Abnormal   Collection Time    12/01/12 12:25 AM      Result Value Range   Sodium 136  135 - 145 mEq/L   Potassium 3.1 (*) 3.5 - 5.1 mEq/L   Chloride 101  96 - 112 mEq/L   CO2 21  19 - 32 mEq/L   Glucose, Bld 98  70 - 99 mg/dL   BUN 4 (*) 6 - 23 mg/dL   Creatinine, Ser 3.32  0.50 - 1.10 mg/dL   Calcium 9.6  8.4 - 95.1 mg/dL   Total Protein 7.0  6.0 - 8.3 g/dL   Albumin 3.7  3.5 - 5.2 g/dL   AST 15  0 - 37 U/L   ALT 7  0 - 35 U/L   Alkaline Phosphatase 50  39 - 117 U/L   Total Bilirubin 0.4  0.3 - 1.2 mg/dL   GFR calc non Af Amer >90  >90 mL/min   GFR calc Af Amer >90  >90 mL/min  URINALYSIS, ROUTINE W REFLEX MICROSCOPIC     Status: Abnormal   Collection Time  12/01/12 12:40 AM      Result Value Range   Color, Urine YELLOW  YELLOW   APPearance CLEAR  CLEAR   Specific Gravity, Urine 1.020  1.005 - 1.030   pH 6.0  5.0 - 8.0   Glucose, UA NEGATIVE  NEGATIVE mg/dL   Hgb urine dipstick MODERATE (*) NEGATIVE   Bilirubin Urine NEGATIVE  NEGATIVE   Ketones, ur >80 (*) NEGATIVE mg/dL   Protein, ur 30 (*) NEGATIVE mg/dL   Urobilinogen, UA 1.0  0.0 - 1.0 mg/dL   Nitrite NEGATIVE  NEGATIVE   Leukocytes, UA TRACE (*) NEGATIVE  URINE MICROSCOPIC-ADD ON     Status: Abnormal   Collection Time    12/01/12 12:40 AM      Result Value Range   Squamous Epithelial / LPF FEW (*) RARE   WBC, UA 3-6  <3 WBC/hpf   RBC / HPF 0-2  <3 RBC/hpf   Bacteria, UA FEW (*) RARE   Urine-Other MUCOUS PRESENT     0120: Spoke with Dr. Jackelyn Knife, plan to offer the patient the medication that she would prefer for her nausea, and send her home with a RX.  0249: Unable to start IV x multiple attempts. Pt has had zofran, and is now keeping down a sprite and requesting crackers. Will continue to monitor PO intake. If she  continues to keep down food and fluids we will dc home with RX for ODT Zofran, per patients preference.  Assessment and Plan   1. Nausea and vomiting in pregnancy prior to [redacted] weeks gestation    RX: zofran 8mg  ODT #30 with 1 RF FU with [facility] Call the office is symptoms are not better.   Tammy Torres 12/01/2012, 1:48 AM

## 2012-12-02 LAB — URINE CULTURE: Colony Count: 8000

## 2013-03-16 ENCOUNTER — Inpatient Hospital Stay (HOSPITAL_COMMUNITY)
Admission: AD | Admit: 2013-03-16 | Discharge: 2013-03-16 | Disposition: A | Discharge status: Home / Self Care | Payer: Medicaid Other | Source: Ambulatory Visit | Attending: Obstetrics and Gynecology | Admitting: Obstetrics and Gynecology

## 2013-03-16 ENCOUNTER — Encounter (HOSPITAL_COMMUNITY): Payer: Self-pay | Admitting: *Deleted

## 2013-03-16 DIAGNOSIS — R059 Cough, unspecified: Secondary | ICD-10-CM | POA: Insufficient documentation

## 2013-03-16 DIAGNOSIS — O2343 Unspecified infection of urinary tract in pregnancy, third trimester: Secondary | ICD-10-CM

## 2013-03-16 DIAGNOSIS — R05 Cough: Secondary | ICD-10-CM | POA: Insufficient documentation

## 2013-03-16 DIAGNOSIS — O219 Vomiting of pregnancy, unspecified: Secondary | ICD-10-CM

## 2013-03-16 DIAGNOSIS — O212 Late vomiting of pregnancy: Secondary | ICD-10-CM | POA: Insufficient documentation

## 2013-03-16 DIAGNOSIS — N390 Urinary tract infection, site not specified: Secondary | ICD-10-CM | POA: Insufficient documentation

## 2013-03-16 DIAGNOSIS — R109 Unspecified abdominal pain: Secondary | ICD-10-CM | POA: Insufficient documentation

## 2013-03-16 DIAGNOSIS — O368190 Decreased fetal movements, unspecified trimester, not applicable or unspecified: Secondary | ICD-10-CM | POA: Insufficient documentation

## 2013-03-16 DIAGNOSIS — O2390 Unspecified genitourinary tract infection in pregnancy, unspecified trimester: Secondary | ICD-10-CM

## 2013-03-16 HISTORY — DX: Reserved for concepts with insufficient information to code with codable children: IMO0002

## 2013-03-16 LAB — URINALYSIS, ROUTINE W REFLEX MICROSCOPIC
Ketones, ur: 15 mg/dL — AB
Nitrite: NEGATIVE
Specific Gravity, Urine: 1.02 (ref 1.005–1.030)
Urobilinogen, UA: 1 mg/dL (ref 0.0–1.0)

## 2013-03-16 LAB — URINE MICROSCOPIC-ADD ON

## 2013-03-16 MED ORDER — PREDNISONE 5 MG PO TABS: 10.0000 mg | ORAL_TABLET | Freq: Every day | ORAL | Status: DC

## 2013-03-16 MED ORDER — SODIUM CHLORIDE 0.9 % IV SOLN
25.0000 mg | Freq: Once | INTRAVENOUS | Status: AC
  Administered 2013-03-16: 25 mg via INTRAVENOUS
  Filled 2013-03-16: qty 1

## 2013-03-16 MED ORDER — ALBUTEROL SULFATE (5 MG/ML) 0.5% IN NEBU
2.5000 mg | INHALATION_SOLUTION | Freq: Once | RESPIRATORY_TRACT | Status: AC
  Administered 2013-03-16: 2.5 mg via RESPIRATORY_TRACT
  Filled 2013-03-16: qty 0.5

## 2013-03-16 MED ORDER — CEPHALEXIN 500 MG PO CAPS: 500.0000 mg | ORAL_CAPSULE | Freq: Four times a day (QID) | ORAL | Status: DC

## 2013-03-16 MED ORDER — DEXTROSE 5 % IV SOLN
1.0000 g | Freq: Once | INTRAVENOUS | Status: AC
  Administered 2013-03-16: 1 g via INTRAVENOUS
  Filled 2013-03-16: qty 10

## 2013-03-16 NOTE — MAU Note (Signed)
Patient c/o of nausea and vomiting yesterday and today. Patient reports unable to keep food down yesterday or today and reports that anti-emetic didn't help. Patient denies vaginal bleeding and lof. Patient reports decreased fetal movement yesterday but today she is moving once arrived at MAU. Patient also reports coughing and wheezing today r/t asthma and took albuterol this morning.

## 2013-03-16 NOTE — MAU Note (Signed)
Started with a cold, now having vomiting, couldn't keep anything down yesterday. Got no sleep last night.  Baby was not moving at all yesterday, is moving now.

## 2013-03-16 NOTE — MAU Provider Note (Signed)
  History     CSN: [id number]  Arrival date and time: 03/16/13 4098   First Provider Initiated Contact with Patient 03/16/13 0957      Chief Complaint  Patient presents with  . Nausea  . Emesis  . Decreased Fetal Movement   HPI This is a 22 y.o. female at [redacted]w[redacted]d who presents with c/o cough and wheezing which causes her to vomit.  Had cramps yesterday with tight abdomen.  Baby did not move yesterday but today cramps are gone and baby is moving. Has history of asthma and had to use inhaler this am.  Has no asthma doctor.   OB History   Grav Para Term Preterm Abortions TAB SAB Ect Mult Living   2 1 1       1       Past Medical History  Diagnosis Date  . Asthma     Past Surgical History  Procedure Laterality Date  . Hernia repair      No family history on file.  History  Substance Use Topics  . Smoking status: Former Smoker    Types: Cigarettes    Quit date: 06/14/2008  . Smokeless tobacco: Not on file  . Alcohol Use: No    Allergies: No Known Allergies  Prescriptions prior to admission  Medication Sig Dispense Refill  . albuterol (PROVENTIL HFA;VENTOLIN HFA) 108 (90 BASE) MCG/ACT inhaler Inhale 2 puffs into the lungs every 4 (four) hours as needed for wheezing or shortness of breath.      . ondansetron (ZOFRAN ODT) 8 MG disintegrating tablet Take 1 tablet (8 mg total) by mouth every 8 (eight) hours as needed for nausea.  30 tablet  1    Review of Systems  Constitutional: Negative for fever, chills and malaise/fatigue.  Respiratory: Positive for wheezing. Negative for shortness of breath.   Gastrointestinal: Positive for nausea, vomiting and abdominal pain. Negative for diarrhea and constipation.  Genitourinary: Negative for dysuria.  Neurological: Negative for dizziness, weakness and headaches.   Physical Exam   Last menstrual period 08/15/2012.  Physical Exam  Constitutional: She is oriented to person, place, and time. She appears well-developed and  well-nourished. No distress.  HENT:  Head: Normocephalic.  Cardiovascular: Normal rate.  Exam reveals no gallop and no friction rub.   No murmur heard. Respiratory: Effort normal. No respiratory distress. She has wheezes (Low pitched wheezes bilat R>L). She has no rales. She exhibits no tenderness.  GI: Soft. There is no tenderness.  Musculoskeletal: Normal range of motion.  Neurological: She is alert and oriented to person, place, and time.  Skin: Skin is warm and dry.  Psychiatric: She has a normal mood and affect.    MAU Course  Procedures  MDM Albuterol nebulizer given with good relief.  Looks like UTI so will give IV fluids and one dose of Rocephin. DIscussed with Dr Ellyn Hack.  Assessment and Plan  A:  SIUP at [redacted]w[redacted]d      Nausea of pregnancy      UTI  P:  Discharge home       Will start Keflex and culture urine       Has antiemetic at home    [facility] 03/16/2013, 10:14 AM

## 2013-03-17 LAB — URINE CULTURE: Colony Count: 70000

## 2013-04-06 ENCOUNTER — Inpatient Hospital Stay (HOSPITAL_COMMUNITY)
Admission: AD | Admit: 2013-04-06 | Discharge: 2013-04-06 | Disposition: A | Discharge status: Home / Self Care | Payer: Medicaid Other | Source: Ambulatory Visit | Attending: Obstetrics and Gynecology | Admitting: Obstetrics and Gynecology

## 2013-04-06 ENCOUNTER — Encounter (HOSPITAL_COMMUNITY): Payer: Self-pay | Admitting: *Deleted

## 2013-04-06 DIAGNOSIS — Y92410 Unspecified street and highway as the place of occurrence of the external cause: Secondary | ICD-10-CM | POA: Insufficient documentation

## 2013-04-06 DIAGNOSIS — Z3689 Encounter for other specified antenatal screening: Secondary | ICD-10-CM

## 2013-04-06 DIAGNOSIS — R109 Unspecified abdominal pain: Secondary | ICD-10-CM | POA: Insufficient documentation

## 2013-04-06 DIAGNOSIS — O99891 Other specified diseases and conditions complicating pregnancy: Secondary | ICD-10-CM | POA: Insufficient documentation

## 2013-04-06 DIAGNOSIS — Z36 Encounter for antenatal screening of mother: Secondary | ICD-10-CM

## 2013-04-06 LAB — URINALYSIS, ROUTINE W REFLEX MICROSCOPIC
Bilirubin Urine: NEGATIVE
Ketones, ur: NEGATIVE mg/dL
Nitrite: NEGATIVE
Protein, ur: NEGATIVE mg/dL
Specific Gravity, Urine: <1.005 — ABNORMAL LOW (ref 1.005–1.030)
Urobilinogen, UA: 0.2 mg/dL (ref 0.0–1.0)

## 2013-04-06 LAB — URINE MICROSCOPIC-ADD ON
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
## 2013-05-03 ENCOUNTER — Encounter (HOSPITAL_COMMUNITY): Payer: Self-pay | Admitting: *Deleted

## 2013-05-03 ENCOUNTER — Inpatient Hospital Stay (HOSPITAL_COMMUNITY)
Admission: AD | Admit: 2013-05-03 | Discharge: 2013-05-03 | Disposition: A | Discharge status: Home / Self Care | Payer: Medicaid Other | Source: Ambulatory Visit | Attending: Obstetrics and Gynecology | Admitting: Obstetrics and Gynecology

## 2013-05-03 DIAGNOSIS — O4700 False labor before 37 completed weeks of gestation, unspecified trimester: Secondary | ICD-10-CM | POA: Insufficient documentation

## 2013-05-03 DIAGNOSIS — B3731 Acute candidiasis of vulva and vagina: Secondary | ICD-10-CM | POA: Insufficient documentation

## 2013-05-03 DIAGNOSIS — O479 False labor, unspecified: Secondary | ICD-10-CM

## 2013-05-03 DIAGNOSIS — B373 Candidiasis of vulva and vagina: Secondary | ICD-10-CM

## 2013-05-03 DIAGNOSIS — O2390 Unspecified genitourinary tract infection in pregnancy, unspecified trimester: Secondary | ICD-10-CM | POA: Insufficient documentation

## 2013-05-03 HISTORY — DX: Urinary tract infection, site not specified: N39.0

## 2013-05-03 LAB — URINE MICROSCOPIC-ADD ON

## 2013-05-03 LAB — URINALYSIS, ROUTINE W REFLEX MICROSCOPIC
Bilirubin Urine: NEGATIVE
Glucose, UA: NEGATIVE mg/dL
Protein, ur: NEGATIVE mg/dL
Specific Gravity, Urine: 1.02 (ref 1.005–1.030)
pH: 7 (ref 5.0–8.0)

## 2013-05-03 LAB — WET PREP, GENITAL

## 2013-05-03 MED ORDER — FLUCONAZOLE 150 MG PO TABS: 150.0000 mg | ORAL_TABLET | Freq: Every day | ORAL | Status: DC

## 2013-05-03 MED ORDER — FLUCONAZOLE 150 MG PO TABS
150.0000 mg | ORAL_TABLET | Freq: Once | ORAL | Status: AC
  Administered 2013-05-03: 150 mg via ORAL
  Filled 2013-05-03: qty 1

## 2013-05-03 NOTE — MAU Provider Note (Signed)
History     CSN: [id number]  Arrival date and time: 05/03/13 1743   First Provider Initiated Contact with Patient 05/03/13 1849      Chief Complaint  Patient presents with  . Contractions   HPI  Ms. Tammy Torres is a 22 y.o. female [redacted]w[redacted]d G2P1001 who presents for contractions that start in her lower right abdomen and radiate to her lower back. Pt is also complaining of a thick, white vaginal discharge; vaginal itching and irritation. She has had problems with yeast infections throughout her pregnancy. She reports good fetal movement, denies LOF,vaginal bleeding, h/a, dizziness, n/v, or fever/chills.    OB History   Grav Para Term Preterm Abortions TAB SAB Ect Mult Living   2 1 1       1       Past Medical History  Diagnosis Date  . Asthma   . Abnormal Pap smear   . UTI (urinary tract infection)     Past Surgical History  Procedure Laterality Date  . Hernia repair      Family History  Problem Relation Age of Onset  . Alcohol abuse Neg Hx   . Arthritis Neg Hx   . Asthma Neg Hx   . Birth defects Neg Hx   . Cancer Neg Hx   . COPD Neg Hx   . Depression Neg Hx   . Diabetes Neg Hx   . Drug abuse Neg Hx   . Early death Neg Hx   . Hearing loss Neg Hx   . Heart disease Neg Hx   . Hyperlipidemia Neg Hx   . Hypertension Neg Hx   . Kidney disease Neg Hx   . Learning disabilities Neg Hx   . Mental illness Neg Hx   . Mental retardation Neg Hx   . Miscarriages / Stillbirths Neg Hx   . Stroke Neg Hx   . Vision loss Neg Hx     History  Substance Use Topics  . Smoking status: Former Smoker    Types: Cigarettes    Quit date: 06/14/2008  . Smokeless tobacco: Not on file  . Alcohol Use: No    Allergies: No Known Allergies  Prescriptions prior to admission  Medication Sig Dispense Refill  . albuterol (PROVENTIL HFA;VENTOLIN HFA) 108 (90 BASE) MCG/ACT inhaler Inhale 2 puffs into the lungs every 4 (four) hours as needed for wheezing or shortness of breath.      .  ondansetron (ZOFRAN ODT) 8 MG disintegrating tablet Take 1 tablet (8 mg total) by mouth every 8 (eight) hours as needed for nausea.  30 tablet  1   Results for orders placed during the hospital encounter of 05/03/13 (from the past 24 hour(s))  URINALYSIS, ROUTINE W REFLEX MICROSCOPIC     Status: Abnormal   Collection Time    05/03/13  6:00 PM      Result Value Range   Color, Urine YELLOW  YELLOW   APPearance CLEAR  CLEAR   Specific Gravity, Urine 1.020  1.005 - 1.030   pH 7.0  5.0 - 8.0   Glucose, UA NEGATIVE  NEGATIVE mg/dL   Hgb urine dipstick TRACE (*) NEGATIVE   Bilirubin Urine NEGATIVE  NEGATIVE   Ketones, ur NEGATIVE  NEGATIVE mg/dL   Protein, ur NEGATIVE  NEGATIVE mg/dL   Urobilinogen, UA 0.2  0.0 - 1.0 mg/dL   Nitrite NEGATIVE  NEGATIVE   Leukocytes, UA MODERATE (*) NEGATIVE  URINE MICROSCOPIC-ADD ON     Status: Abnormal  Collection Time    05/03/13  6:00 PM      Result Value Range   Squamous Epithelial / LPF FEW (*) RARE   WBC, UA 11-20  <3 WBC/hpf   RBC / HPF 3-6  <3 RBC/hpf   Bacteria, UA MANY (*) RARE   Urine-Other MUCOUS PRESENT    WET PREP, GENITAL     Status: Abnormal   Collection Time    05/03/13  6:48 PM      Result Value Range   Yeast Wet Prep HPF POC TOO NUMEROUS TO COUNT (*) NONE SEEN   Trich, Wet Prep NONE SEEN  NONE SEEN   Clue Cells Wet Prep HPF POC FEW (*) NONE SEEN   WBC, Wet Prep HPF POC FEW (*) NONE SEEN    Review of Systems  Constitutional: Negative for fever and chills.  Gastrointestinal: Positive for nausea and abdominal pain. Negative for vomiting, diarrhea and constipation.  Genitourinary: Negative for dysuria, urgency, frequency and hematuria.       + vaginal discharge with vaginal irritation  No vaginal bleeding. No dysuria.    Physical Exam   Blood pressure 111/68, pulse 90, temperature 97.7 F (36.5 C), temperature source Oral, resp. rate 18, height 5\' 3"  (1.6 m), weight 156 lb 9.6 oz (71.033 kg), last menstrual period  08/15/2012.  Physical Exam  Constitutional: She is oriented to person, place, and time. She appears well-developed and well-nourished. No distress.  Respiratory: Effort normal.  GI: Soft. She exhibits no distension. There is tenderness. There is rebound.  Genitourinary: Vaginal discharge found.  Speculum exam: Vagina - Small amount of thick, white, curd like discharge, no odor, no pooling of fluid in vaginal canal  Cervix - No contact bleeding wet prep done Chaperone present for exam.   Neurological: She is alert and oriented to person, place, and time.  Skin: Skin is warm. She is not diaphoretic.   Dilation: 1 cm Effacement: 50% Cervical position: Posterior  Station: -3 Presentation: Vertex  Exam by: Venia Carbon FNP   Fetal Tracing: Baseline: 130 bpm Variability: moderate Accelerations: 15x15 Decelerations:None  Toco: Irregular contraction pattern.   MAU Course  Procedures None  MDM UA Urine culture is pending  Wet prep Consulted with Dr. Senaida Ores ok to treat yeast and send home   Assessment and Plan  A: Yeast Vaginitis Braxton hicks contractions  P: Discharge home Rx: diflucan times 2 doses Follow up in the office as scheduled with Dr.Richardson Return to MAU as needed Labor precautions discussed    Lucille Witts IRENE FNP-C 05/03/2013, 7:38 PM

## 2013-05-03 NOTE — MAU Note (Addendum)
C/O mid abdominal pain and back pain intermittent since 0500. States her ears also hurt. Yellow mucous discharge at 1730. Denies hx herpes or MRSA

## 2013-05-03 NOTE — Progress Notes (Signed)
Patient states she no longer feels contractions or any pain in abdomen. States her back pain is down to a "2." Feels much better now.

## 2013-05-04 LAB — URINE CULTURE: Colony Count: 50000

## 2013-05-05 LAB — OB RESULTS CONSOLE GBS: GBS: POSITIVE

## 2013-05-05 LAB — OB RESULTS CONSOLE GC/CHLAMYDIA: Chlamydia: NEGATIVE

## 2013-05-21 ENCOUNTER — Encounter (HOSPITAL_COMMUNITY): Payer: Self-pay | Admitting: *Deleted

## 2013-05-21 ENCOUNTER — Inpatient Hospital Stay (HOSPITAL_COMMUNITY)
Admission: AD | Admit: 2013-05-21 | Discharge: 2013-05-21 | Disposition: A | Discharge status: Home / Self Care | Payer: Medicaid Other | Source: Ambulatory Visit | Attending: Obstetrics and Gynecology | Admitting: Obstetrics and Gynecology

## 2013-05-21 DIAGNOSIS — O479 False labor, unspecified: Secondary | ICD-10-CM | POA: Insufficient documentation

## 2013-05-21 NOTE — MAU Note (Signed)
Contractions since 0400. No leaking fld or bleeding

## 2013-05-31 ENCOUNTER — Encounter: Payer: Self-pay | Admitting: Internal Medicine

## 2013-05-31 ENCOUNTER — Ambulatory Visit (INDEPENDENT_AMBULATORY_CARE_PROVIDER_SITE_OTHER): Payer: Medicaid Other | Admitting: Internal Medicine

## 2013-05-31 VITALS — BP 118/80 | HR 87 | Temp 98.0°F | Ht 63.0 in | Wt 168.4 lb

## 2013-05-31 DIAGNOSIS — J45909 Unspecified asthma, uncomplicated: Secondary | ICD-10-CM

## 2013-05-31 MED ORDER — PREDNISONE (PAK) 10 MG PO TABS: ORAL_TABLET | ORAL | Status: DC

## 2013-05-31 MED ORDER — ALBUTEROL SULFATE HFA 108 (90 BASE) MCG/ACT IN AERS: 2.0000 | INHALATION_SPRAY | RESPIRATORY_TRACT | Status: AC | PRN

## 2013-05-31 MED ORDER — ALBUTEROL SULFATE HFA 108 (90 BASE) MCG/ACT IN AERS: 2.0000 | INHALATION_SPRAY | Freq: Four times a day (QID) | RESPIRATORY_TRACT | Status: DC | PRN

## 2013-05-31 NOTE — Patient Instructions (Signed)
Prednisone 10 mg take  4 each am x 2 days,   2 each am x 2 days,  1 each am x 2 days and stop  Proair up to 2 pffs every 4 hours but the goal is less twice a week  Return here after the baby is born if still needing proair more than twice a week

## 2013-05-31 NOTE — Progress Notes (Signed)
  Subjective:    Patient ID: Tammy Torres, female    DOB: [birth year] MRN: [id number]  HPI  21 yobf never smoker asthma as child never completely resolved but never on maint rx,  couldn't do track in HS and typically still needed the rescue inhaler at least twice a week but did ok 1st pregnancy but ever since 2nd  Pregnancy, around Feb 2014 had trouble with poorly controlled asthma referred by Dr Senaida Ores at 38 week for asthma eval pre-induction.  05/31/2013 1st [facility] office visit/ Wert cc  hs cough mostly dry then evolves to wheeze then takes saba and feels better p 30 min and then able to sleep 4 hours  then needs more saba,  better p last took prednisone. Much less need for saba daytime but not very active. No assoc nasal complaints.  No obvious day to day or daytime variabilty or assoc cp or chest tightness, overt sinus or hb symptoms. No unusual exp hx or h/o childhood pna  or knowledge of premature birth.  . Also denies any obvious fluctuation of symptoms with weather or environmental changes or other aggravating or alleviating factors except as outlined above   Current Medications, Allergies, Complete Past Medical History, Past Surgical History, Family History, and Social History were reviewed in [employer] record.       Review of Systems  Constitutional: Negative for fever and unexpected weight change.  HENT: Negative for congestion, dental problem, ear pain, nosebleeds, postnasal drip, rhinorrhea, sinus pressure, sneezing, sore throat and trouble swallowing.   Eyes: Negative for redness and itching.  Respiratory: Positive for shortness of breath and wheezing. Negative for cough and chest tightness.   Cardiovascular: Negative for palpitations and leg swelling.  Gastrointestinal: Negative for nausea and vomiting.  Genitourinary: Negative for dysuria.  Musculoskeletal: Negative for joint swelling.  Skin: Negative for rash.  Neurological: Negative  for headaches.  Hematological: Does not bruise/bleed easily.  Psychiatric/Behavioral: Negative for dysphoric mood. The patient is not nervous/anxious.        Objective:   Physical Exam  Wt Readings from Last 3 Encounters:  05/31/13 168 lb 6.4 oz (76.386 kg)  05/21/13 159 lb 9.6 oz (72.394 kg)  05/03/13 156 lb 9.6 oz (71.033 kg)      HEENT: nl dentition, turbinates, and orophanx. Nl external ear canals without cough reflex   NECK :  without JVD/Nodes/TM/ nl carotid upstrokes bilaterally   LUNGS: no acc muscle use, clear to A and P bilaterally without cough on insp or exp maneuvers   CV:  RRR  no s3 or murmur or increase in P2, no edema   ABD:  soft and non tender c/w stated gestational age IUP with nl excursion in the supine position. No bruits or organomegaly, bowel sounds nl  MS:  warm without deformities, calf tenderness, cyanosis or clubbing  SKIN: warm and dry without lesions    NEURO:  alert, approp, no deficits        Assessment & Plan:

## 2013-06-01 ENCOUNTER — Telehealth (HOSPITAL_COMMUNITY): Payer: Self-pay | Admitting: *Deleted

## 2013-06-01 ENCOUNTER — Encounter (HOSPITAL_COMMUNITY): Payer: Self-pay | Admitting: *Deleted

## 2013-06-01 NOTE — Telephone Encounter (Signed)
Preadmission screen  

## 2013-06-02 ENCOUNTER — Encounter: Payer: Self-pay | Admitting: Internal Medicine

## 2013-06-02 DIAGNOSIS — J45909 Unspecified asthma, uncomplicated: Secondary | ICD-10-CM | POA: Insufficient documentation

## 2013-06-02 NOTE — Assessment & Plan Note (Signed)
She clearly has chronic poorly controlled asthma by standard criteria but exac by pregnancy, ? Related to effect on LES with silent noct gerd but soon to be rectified by induction so the short term goal is to get her through the induction and then regroup post delivery if continues to break the rule of two's with too much saba use and poorly controlled noct symptoms, reviewed with pt.  See instructions for specific recommendations which were reviewed directly with the patient who was given a copy with highlighter outlining the key components.

## 2013-06-05 NOTE — H&P (Signed)
Tammy Torres is a 22 y.o. female G2P1001 at 45 6/7 weeks (EDD 06/07/13 by 6 week [place])   presenting for IOL at term.  Prenatal care complicated by +GBS status and asthma flair managed with steroid dose pack x 2 and inhalers.    Maternal Medical History:  Fetal activity: Perceived fetal activity is normal.    Prenatal Complications - Diabetes: none.    OB History   Grav Para Term Preterm Abortions TAB SAB Ect Mult Living   2 1 1       1     2011 NSVD 7#14oz  Past Medical History  Diagnosis Date  . Asthma   . Abnormal Pap smear   . UTI (urinary tract infection)    Past Surgical History  Procedure Laterality Date  . Hernia repair     Family History: family history is negative for Alcohol abuse, Arthritis, Asthma, Birth defects, Cancer, COPD, Depression, Diabetes, Drug abuse, Early death, Hearing loss, Heart disease, Hyperlipidemia, Hypertension, Kidney disease, Learning disabilities, Mental illness, Mental retardation, Miscarriages / Stillbirths, Stroke, and Vision loss. Social History:  reports that she has never smoked. She does not have any smokeless tobacco history on file. She reports that she does not drink alcohol or use illicit drugs.   Prenatal Transfer Tool  Maternal Diabetes: No Genetic Screening: Normal Maternal Ultrasounds/Referrals: Normal Fetal Ultrasounds or other Referrals:  None Maternal Substance Abuse:  No Significant Maternal Medications:  None Significant Maternal Lab Results:  Lab values include: Group B Strep positive Other Comments:  None  ROS    Last menstrual period 08/15/2012. Maternal Exam:  Uterine Assessment: Contraction strength is mild.  Contraction frequency is irregular.   Abdomen: Patient reports no abdominal tenderness. Fetal presentation: vertex  Introitus: Normal vulva. Normal vagina.    Physical Exam  Constitutional: She appears well-developed and well-nourished.  Cardiovascular: Normal rate and regular rhythm.   Respiratory:  Effort normal and breath sounds normal.  GI: Soft.  Genitourinary: Vagina normal.  Gravid uterus  Neurological: She is alert.  Psychiatric: She has a normal mood and affect.    Prenatal labs: ABO, Rh: A/Positive/-- (04/28 0000) Antibody: Negative (04/28 0000) Rubella: Nonimmune (04/28 0000) RPR: Nonreactive (04/28 0000)  HBsAg: Negative (04/28 0000)  HIV: Non-reactive (04/28 0000)  GBS: Positive (10/23 0000)  One hour WNL at 81 First trimester screen and AFP negative  Assessment/Plan: Pt for IOL at term with favorable cervix.  Plan AROM and pitocin.   Oliver Pila 06/05/2013, 8:35 AM

## 2013-06-06 ENCOUNTER — Inpatient Hospital Stay (HOSPITAL_COMMUNITY)
Admission: AD | Admit: 2013-06-06 | Discharge: 2013-06-08 | DRG: 775 | Disposition: A | Discharge status: Home / Self Care | Payer: Medicaid Other | Source: Ambulatory Visit | Attending: Obstetrics and Gynecology | Admitting: Obstetrics and Gynecology

## 2013-06-06 ENCOUNTER — Encounter (HOSPITAL_COMMUNITY): Payer: Medicaid Other | Admitting: Anesthesiology

## 2013-06-06 ENCOUNTER — Encounter (HOSPITAL_COMMUNITY): Payer: Self-pay | Admitting: Anesthesiology

## 2013-06-06 ENCOUNTER — Inpatient Hospital Stay (HOSPITAL_COMMUNITY): Payer: Medicaid Other | Admitting: Anesthesiology

## 2013-06-06 ENCOUNTER — Inpatient Hospital Stay (HOSPITAL_COMMUNITY)
Admission: RE | Admit: 2013-06-06 | Discharge: 2013-06-06 | Disposition: A | Discharge status: Home / Self Care | Payer: No Typology Code available for payment source | Source: Ambulatory Visit | Attending: Obstetrics and Gynecology | Admitting: Obstetrics and Gynecology

## 2013-06-06 LAB — CBC
HCT: 33.5 % — ABNORMAL LOW (ref 36.0–46.0)
Hemoglobin: 11 g/dL — ABNORMAL LOW (ref 12.0–15.0)
MCH: 26.6 pg (ref 26.0–34.0)
MCV: 80.9 fL (ref 78.0–100.0)
Platelets: 153 10*3/uL (ref 150–400)
RBC: 4.14 MIL/uL (ref 3.87–5.11)
RDW: 13.9 % (ref 11.5–15.5)
WBC: 13.1 10*3/uL — ABNORMAL HIGH (ref 4.0–10.5)

## 2013-06-06 MED ORDER — LIDOCAINE HCL (PF) 1 % IJ SOLN
30.0000 mL | INTRAMUSCULAR | Status: DC | PRN
  Filled 2013-06-06: qty 30

## 2013-06-06 MED ORDER — LACTATED RINGERS IV SOLN: INTRAVENOUS | Status: DC

## 2013-06-06 MED ORDER — ONDANSETRON 8 MG PO TBDP
8.0000 mg | ORAL_TABLET | Freq: Three times a day (TID) | ORAL | Status: DC | PRN
  Filled 2013-06-06: qty 1

## 2013-06-06 MED ORDER — LACTATED RINGERS IV SOLN
INTRAVENOUS | Status: DC
  Administered 2013-06-06: 02:00:00 via INTRAVENOUS

## 2013-06-06 MED ORDER — ZOLPIDEM TARTRATE 5 MG PO TABS: 5.0000 mg | ORAL_TABLET | Freq: Every evening | ORAL | Status: DC | PRN

## 2013-06-06 MED ORDER — TETANUS-DIPHTH-ACELL PERTUSSIS 5-2.5-18.5 LF-MCG/0.5 IM SUSP: 0.5000 mL | Freq: Once | INTRAMUSCULAR | Status: DC

## 2013-06-06 MED ORDER — PRENATAL MULTIVITAMIN CH
1.0000 | ORAL_TABLET | Freq: Every day | ORAL | Status: DC
  Administered 2013-06-06 – 2013-06-07 (×2): 1 via ORAL
  Filled 2013-06-06 (×2): qty 1

## 2013-06-06 MED ORDER — PREDNISONE (PAK) 10 MG PO TABS: ORAL_TABLET | Freq: Every morning | ORAL | Status: DC

## 2013-06-06 MED ORDER — DEXTROSE 5 % IV SOLN
5.0000 10*6.[IU] | Freq: Once | INTRAVENOUS | Status: AC
  Administered 2013-06-06: 5 10*6.[IU] via INTRAVENOUS
  Filled 2013-06-06: qty 5

## 2013-06-06 MED ORDER — OXYTOCIN 40 UNITS IN LACTATED RINGERS INFUSION - SIMPLE MED
62.5000 mL/h | INTRAVENOUS | Status: DC
  Administered 2013-06-06: 62.5 mL/h via INTRAVENOUS

## 2013-06-06 MED ORDER — ONDANSETRON HCL 4 MG PO TABS: 4.0000 mg | ORAL_TABLET | ORAL | Status: DC | PRN

## 2013-06-06 MED ORDER — DIBUCAINE 1 % RE OINT
1.0000 "application " | TOPICAL_OINTMENT | RECTAL | Status: DC | PRN
  Filled 2013-06-06: qty 28

## 2013-06-06 MED ORDER — IBUPROFEN 600 MG PO TABS: 600.0000 mg | ORAL_TABLET | Freq: Four times a day (QID) | ORAL | Status: DC | PRN

## 2013-06-06 MED ORDER — SENNOSIDES-DOCUSATE SODIUM 8.6-50 MG PO TABS
2.0000 | ORAL_TABLET | ORAL | Status: DC
  Administered 2013-06-06: 2 via ORAL
  Filled 2013-06-06 (×2): qty 2

## 2013-06-06 MED ORDER — OXYTOCIN 40 UNITS IN LACTATED RINGERS INFUSION - SIMPLE MED
1.0000 m[IU]/min | INTRAVENOUS | Status: DC
  Administered 2013-06-06: 2 m[IU]/min via INTRAVENOUS
  Filled 2013-06-06: qty 1000

## 2013-06-06 MED ORDER — FENTANYL 2.5 MCG/ML BUPIVACAINE 1/10 % EPIDURAL INFUSION (WH - ANES)
14.0000 mL/h | INTRAMUSCULAR | Status: DC | PRN
  Filled 2013-06-06: qty 125

## 2013-06-06 MED ORDER — ONDANSETRON HCL 4 MG/2ML IJ SOLN: 4.0000 mg | Freq: Four times a day (QID) | INTRAMUSCULAR | Status: DC | PRN

## 2013-06-06 MED ORDER — ACETAMINOPHEN 325 MG PO TABS: 650.0000 mg | ORAL_TABLET | ORAL | Status: DC | PRN

## 2013-06-06 MED ORDER — LANOLIN HYDROUS EX OINT: TOPICAL_OINTMENT | CUTANEOUS | Status: DC | PRN

## 2013-06-06 MED ORDER — ALBUTEROL SULFATE HFA 108 (90 BASE) MCG/ACT IN AERS: 2.0000 | INHALATION_SPRAY | RESPIRATORY_TRACT | Status: DC | PRN

## 2013-06-06 MED ORDER — BENZOCAINE-MENTHOL 20-0.5 % EX AERO
1.0000 "application " | INHALATION_SPRAY | CUTANEOUS | Status: DC | PRN
  Administered 2013-06-06: 1 via TOPICAL
  Filled 2013-06-06 (×2): qty 56

## 2013-06-06 MED ORDER — BUTORPHANOL TARTRATE 1 MG/ML IJ SOLN: 1.0000 mg | INTRAMUSCULAR | Status: DC | PRN

## 2013-06-06 MED ORDER — PREDNISONE 10 MG PO TABS
10.0000 mg | ORAL_TABLET | Freq: Every day | ORAL | Status: AC
  Administered 2013-06-07 – 2013-06-08 (×2): 10 mg via ORAL

## 2013-06-06 MED ORDER — ONDANSETRON HCL 4 MG/2ML IJ SOLN: 4.0000 mg | INTRAMUSCULAR | Status: DC | PRN

## 2013-06-06 MED ORDER — DIPHENHYDRAMINE HCL 25 MG PO CAPS
25.0000 mg | ORAL_CAPSULE | Freq: Four times a day (QID) | ORAL | Status: DC | PRN
  Administered 2013-06-06: 25 mg via ORAL
  Filled 2013-06-06: qty 1

## 2013-06-06 MED ORDER — FENTANYL 2.5 MCG/ML BUPIVACAINE 1/10 % EPIDURAL INFUSION (WH - ANES)
INTRAMUSCULAR | Status: DC | PRN
  Administered 2013-06-06: 14 mL/h via EPIDURAL

## 2013-06-06 MED ORDER — OXYTOCIN BOLUS FROM INFUSION: 500.0000 mL | INTRAVENOUS | Status: DC

## 2013-06-06 MED ORDER — LIDOCAINE HCL (PF) 1 % IJ SOLN
INTRAMUSCULAR | Status: DC | PRN
  Administered 2013-06-06 (×2): 4 mL

## 2013-06-06 MED ORDER — IBUPROFEN 600 MG PO TABS
600.0000 mg | ORAL_TABLET | Freq: Four times a day (QID) | ORAL | Status: DC
  Administered 2013-06-06 – 2013-06-08 (×8): 600 mg via ORAL
  Filled 2013-06-06 (×8): qty 1

## 2013-06-06 MED ORDER — PHENYLEPHRINE 40 MCG/ML (10ML) SYRINGE FOR IV PUSH (FOR BLOOD PRESSURE SUPPORT)
80.0000 ug | PREFILLED_SYRINGE | INTRAVENOUS | Status: DC | PRN
  Filled 2013-06-06: qty 10

## 2013-06-06 MED ORDER — LACTATED RINGERS IV SOLN: 500.0000 mL | INTRAVENOUS | Status: DC | PRN

## 2013-06-06 MED ORDER — OXYCODONE-ACETAMINOPHEN 5-325 MG PO TABS
1.0000 | ORAL_TABLET | ORAL | Status: DC | PRN
  Administered 2013-06-07 – 2013-06-08 (×5): 1 via ORAL
  Filled 2013-06-06 (×7): qty 1

## 2013-06-06 MED ORDER — PREDNISONE 10 MG PO TABS
20.0000 mg | ORAL_TABLET | Freq: Once | ORAL | Status: AC
  Administered 2013-06-06: 20 mg via ORAL

## 2013-06-06 MED ORDER — CITRIC ACID-SODIUM CITRATE 334-500 MG/5ML PO SOLN: 30.0000 mL | ORAL | Status: DC | PRN

## 2013-06-06 MED ORDER — TERBUTALINE SULFATE 1 MG/ML IJ SOLN: 0.2500 mg | Freq: Once | INTRAMUSCULAR | Status: DC | PRN

## 2013-06-06 MED ORDER — SIMETHICONE 80 MG PO CHEW: 80.0000 mg | CHEWABLE_TABLET | ORAL | Status: DC | PRN

## 2013-06-06 MED ORDER — OXYCODONE-ACETAMINOPHEN 5-325 MG PO TABS: 1.0000 | ORAL_TABLET | ORAL | Status: DC | PRN

## 2013-06-06 MED ORDER — WITCH HAZEL-GLYCERIN EX PADS: 1.0000 "application " | MEDICATED_PAD | CUTANEOUS | Status: DC | PRN

## 2013-06-06 MED ORDER — EPHEDRINE 5 MG/ML INJ
10.0000 mg | INTRAVENOUS | Status: DC | PRN
  Filled 2013-06-06: qty 4

## 2013-06-06 MED ORDER — EPHEDRINE 5 MG/ML INJ: 10.0000 mg | INTRAVENOUS | Status: DC | PRN

## 2013-06-06 MED ORDER — PHENYLEPHRINE 40 MCG/ML (10ML) SYRINGE FOR IV PUSH (FOR BLOOD PRESSURE SUPPORT): 80.0000 ug | PREFILLED_SYRINGE | INTRAVENOUS | Status: DC | PRN

## 2013-06-06 MED ORDER — LACTATED RINGERS IV SOLN: 500.0000 mL | Freq: Once | INTRAVENOUS | Status: DC

## 2013-06-06 MED ORDER — DIPHENHYDRAMINE HCL 50 MG/ML IJ SOLN: 12.5000 mg | INTRAMUSCULAR | Status: DC | PRN

## 2013-06-06 MED ORDER — PENICILLIN G POTASSIUM 5000000 UNITS IJ SOLR
2.5000 10*6.[IU] | INTRAVENOUS | Status: DC
  Filled 2013-06-06 (×4): qty 2.5

## 2013-06-06 NOTE — MAU Note (Signed)
Pt reports ROM and labor, scheduled induction for 7:30 am. To L/D for evaluation

## 2013-06-06 NOTE — MAU Note (Signed)
Pt reports leaking fluid since 0030, contractions

## 2013-06-06 NOTE — Anesthesia Postprocedure Evaluation (Signed)
Anesthesia Post Note  Patient: Tammy Torres, Tammy Torres  Procedure(s) Performed: * No procedures listed *  Anesthesia type: Epidural  Patient location: Mother/Baby  Post pain: Pain level controlled  Post assessment: Post-op Vital signs reviewed  Last Vitals:  Filed Vitals:   06/06/13 0733  BP: 119/65  Pulse: 80  Temp: 36.8 C  Resp: 18    Post vital signs: Reviewed  Level of consciousness: awake  Complications: No apparent anesthesia complications

## 2013-06-06 NOTE — Progress Notes (Signed)
Post Partum Day 0 Subjective: no complaints, up ad lib, voiding, tolerating PO and nl lochia, pain controlled  Objective: Blood pressure 119/65, pulse 80, temperature 98.2 F (36.8 C), temperature source Oral, resp. rate 18, height 5\' 3"  (1.6 m), weight 73.936 kg (163 lb), last menstrual period 08/15/2012, SpO2 100.00%.  Physical Exam:  General: alert and no distress Lochia: appropriate Uterine Fundus: firm   Recent Labs  06/06/13 0135  HGB 11.0*  HCT 33.5*    Assessment/Plan: Plan for discharge tomorrow or Wednesday.  Routine PP care.     LOS: 0 days   BOVARD,Gradie Butrick 06/06/2013, 7:57 AM

## 2013-06-06 NOTE — Anesthesia Preprocedure Evaluation (Signed)
Anesthesia Evaluation  Patient identified by MRN, date of birth, ID band Patient awake    Reviewed: Allergy & Precautions, H&P , Patient's Chart, lab work & pertinent test results  Airway Mallampati: III TM Distance: >3 FB Neck ROM: Full    Dental no notable dental hx. (+) Teeth Intact   Pulmonary asthma , Recent URI , Residual Cough,  breath sounds clear to auscultation  Pulmonary exam normal       Cardiovascular negative cardio ROS  Rhythm:Regular Rate:Normal     Neuro/Psych negative neurological ROS  negative psych ROS   GI/Hepatic negative GI ROS, Neg liver ROS,   Endo/Other  negative endocrine ROS  Renal/GU negative Renal ROS  negative genitourinary   Musculoskeletal negative musculoskeletal ROS (+)   Abdominal   Peds  Hematology negative hematology ROS (+)   Anesthesia Other Findings   Reproductive/Obstetrics (+) Pregnancy                           Anesthesia Physical Anesthesia Plan  ASA: II  Anesthesia Plan: Epidural   Post-op Pain Management:    Induction:   Airway Management Planned: Natural Airway  Additional Equipment:   Intra-op Plan:   Post-operative Plan:   Informed Consent: I have reviewed the patients History and Physical, chart, labs and discussed the procedure including the risks, benefits and alternatives for the proposed anesthesia with the patient or authorized representative who has indicated his/her understanding and acceptance.     Plan Discussed with: Anesthesiologist  Anesthesia Plan Comments:         Anesthesia Quick Evaluation

## 2013-06-06 NOTE — Progress Notes (Signed)
UR chart review completed.  

## 2013-06-06 NOTE — Anesthesia Procedure Notes (Signed)
Epidural Patient location during procedure: OB Start time: 06/06/2013 3:27 AM  Staffing Anesthesiologist: Dyson Sevey A. Performed by: anesthesiologist   Preanesthetic Checklist Completed: patient identified, site marked, surgical consent, pre-op evaluation, timeout performed, IV checked, risks and benefits discussed and monitors and equipment checked  Epidural Patient position: sitting Prep: site prepped and draped and DuraPrep Patient monitoring: continuous pulse ox and blood pressure Approach: midline Injection technique: LOR air  Needle:  Needle type: Tuohy  Needle gauge: 17 G Needle length: 9 cm and 9 Needle insertion depth: 4 cm Catheter type: closed end flexible Catheter size: 19 Gauge Catheter at skin depth: 9 cm Test dose: negative and Other  Assessment Events: blood not aspirated (.mf), injection not painful, no injection resistance, negative IV test and no paresthesia  Additional Notes Patient identified. Risks and benefits discussed including failed block, incomplete  Pain control, post dural puncture headache, nerve damage, paralysis, blood pressure Changes, nausea, vomiting, reactions to medications-both toxic and allergic and post Partum back pain. All questions were answered. Patient expressed understanding and wished to proceed. Sterile technique was used throughout procedure. Epidural site was Dressed with sterile barrier dressing. No paresthesias, signs of intravascular injection Or signs of intrathecal spread were encountered.  Patient was more comfortable after the epidural was dosed. Please see RN's note for documentation of vital signs and FHR which are stable.'

## 2013-06-07 LAB — CBC
Hemoglobin: 10.8 g/dL — ABNORMAL LOW (ref 12.0–15.0)
MCH: 26.3 pg (ref 26.0–34.0)
MCHC: 32.4 g/dL (ref 30.0–36.0)
MCV: 81.2 fL (ref 78.0–100.0)
Platelets: 137 10*3/uL — ABNORMAL LOW (ref 150–400)
RBC: 4.1 MIL/uL (ref 3.87–5.11)
RDW: 13.7 % (ref 11.5–15.5)

## 2013-06-07 MED ORDER — MEASLES, MUMPS & RUBELLA VAC ~~LOC~~ INJ
0.5000 mL | INJECTION | Freq: Once | SUBCUTANEOUS | Status: AC
  Administered 2013-06-08: 0.5 mL via SUBCUTANEOUS
  Filled 2013-06-07 (×2): qty 0.5

## 2013-06-07 NOTE — Progress Notes (Signed)
Post Partum Day 1 Subjective: no complaints, up ad lib, voiding, tolerating PO and nl lochia, pain controlled  Objective: Blood pressure 113/68, pulse 67, temperature 97.8 F (36.6 C), temperature source Oral, resp. rate 18, height 5\' 3"  (1.6 m), weight 73.936 kg (163 lb), last menstrual period 08/15/2012, SpO2 94.00%, unknown if currently breastfeeding.  Physical Exam:  General: alert and no distress Lochia: appropriate Uterine Fundus: firm   Recent Labs  06/06/13 0135 06/07/13 0607  HGB 11.0* 10.8*  HCT 33.5* 33.3*    Assessment/Plan: Plan for discharge tomorrow or today,  Doing well.   LOS: 1 day   BOVARD,Cevin Rubinstein 06/07/2013, 7:39 AM

## 2013-06-08 MED ORDER — IBUPROFEN 800 MG PO TABS: 800.0000 mg | ORAL_TABLET | Freq: Three times a day (TID) | ORAL | Status: DC | PRN

## 2013-06-08 MED ORDER — OXYCODONE-ACETAMINOPHEN 5-325 MG PO TABS: 1.0000 | ORAL_TABLET | Freq: Four times a day (QID) | ORAL | Status: DC | PRN

## 2013-06-08 NOTE — Progress Notes (Addendum)
Post Partum Day 2 Subjective: no complaints, up ad lib, voiding, tolerating PO and nl lochia, pain well controlled  Objective: Blood pressure 116/74, pulse 97, temperature 97.8 F (36.6 C), temperature source Oral, resp. rate 18, height 5\' 3"  (1.6 m), weight 73.936 kg (163 lb), last menstrual period 08/15/2012, SpO2 94.00%, unknown if currently breastfeeding.  Physical Exam:  General: alert and no distress Lochia: appropriate Uterine Fundus: firm   Recent Labs  06/06/13 0135 06/07/13 0607  HGB 11.0* 10.8*  HCT 33.5* 33.3*    Assessment/Plan: Discharge home.  Routine care.  D/C home with motrin/percocet/ pnv.  F/u 6 weeks   LOS: 2 days   BOVARD,Cailey Trigueros 06/08/2013, 8:14 AM

## 2013-06-08 NOTE — Discharge Summary (Signed)
Obstetric Discharge Summary Reason for Admission: onset of labor Prenatal Procedures: none Intrapartum Procedures: spontaneous vaginal delivery Postpartum Procedures: Rubella Ig Complications-Operative and Postpartum: none Hemoglobin  Date Value Range Status  06/07/2013 10.8* 12.0 - 15.0 g/dL Final     HCT  Date Value Range Status  06/07/2013 33.3* 36.0 - 46.0 % Final    Physical Exam:  General: alert and no distress Lochia: appropriate Uterine Fundus: firm  Discharge Diagnoses: Term Pregnancy-delivered  Discharge Information: Date: 06/08/2013 Activity: pelvic rest Diet: routine Medications: PNV, Ibuprofen and Percocet Condition: stable Instructions: refer to practice specific booklet Discharge to: home Follow-up Information   Follow up with Tammy Torres,Tammy Lubeck, Tammy Torres. Schedule an appointment as soon as possible for a visit in 6 weeks.   Specialty:  Obstetrics and Gynecology   Contact information:   [address] [place] [address] [phone]       Newborn Data: Live born female  Birth Weight: 7 lb 8.6 oz (3419 g) APGAR: 8, 9  Home with mother.  Tammy Torres,Janila Arrazola 06/08/2013, 8:45 AM

## 2013-06-08 NOTE — H&P (Signed)
  Pt came in overnight before induction of labor. Please see Dr. Berenda Morale H&P Came in for r/o ROM, had some ctx, augmented with pitocin Proceeded to delivery.

## 2014-05-15 ENCOUNTER — Encounter (HOSPITAL_COMMUNITY): Payer: Self-pay | Admitting: Anesthesiology

## 2014-05-28 IMAGING — CR DG CHEST 2V
2 series · 2 of 2 positions shown · non-contrast
Comparison: February 29, 2008

CLINICAL DATA: Cough and chest tightness

CHEST - 2 VIEW

[w chest pa]
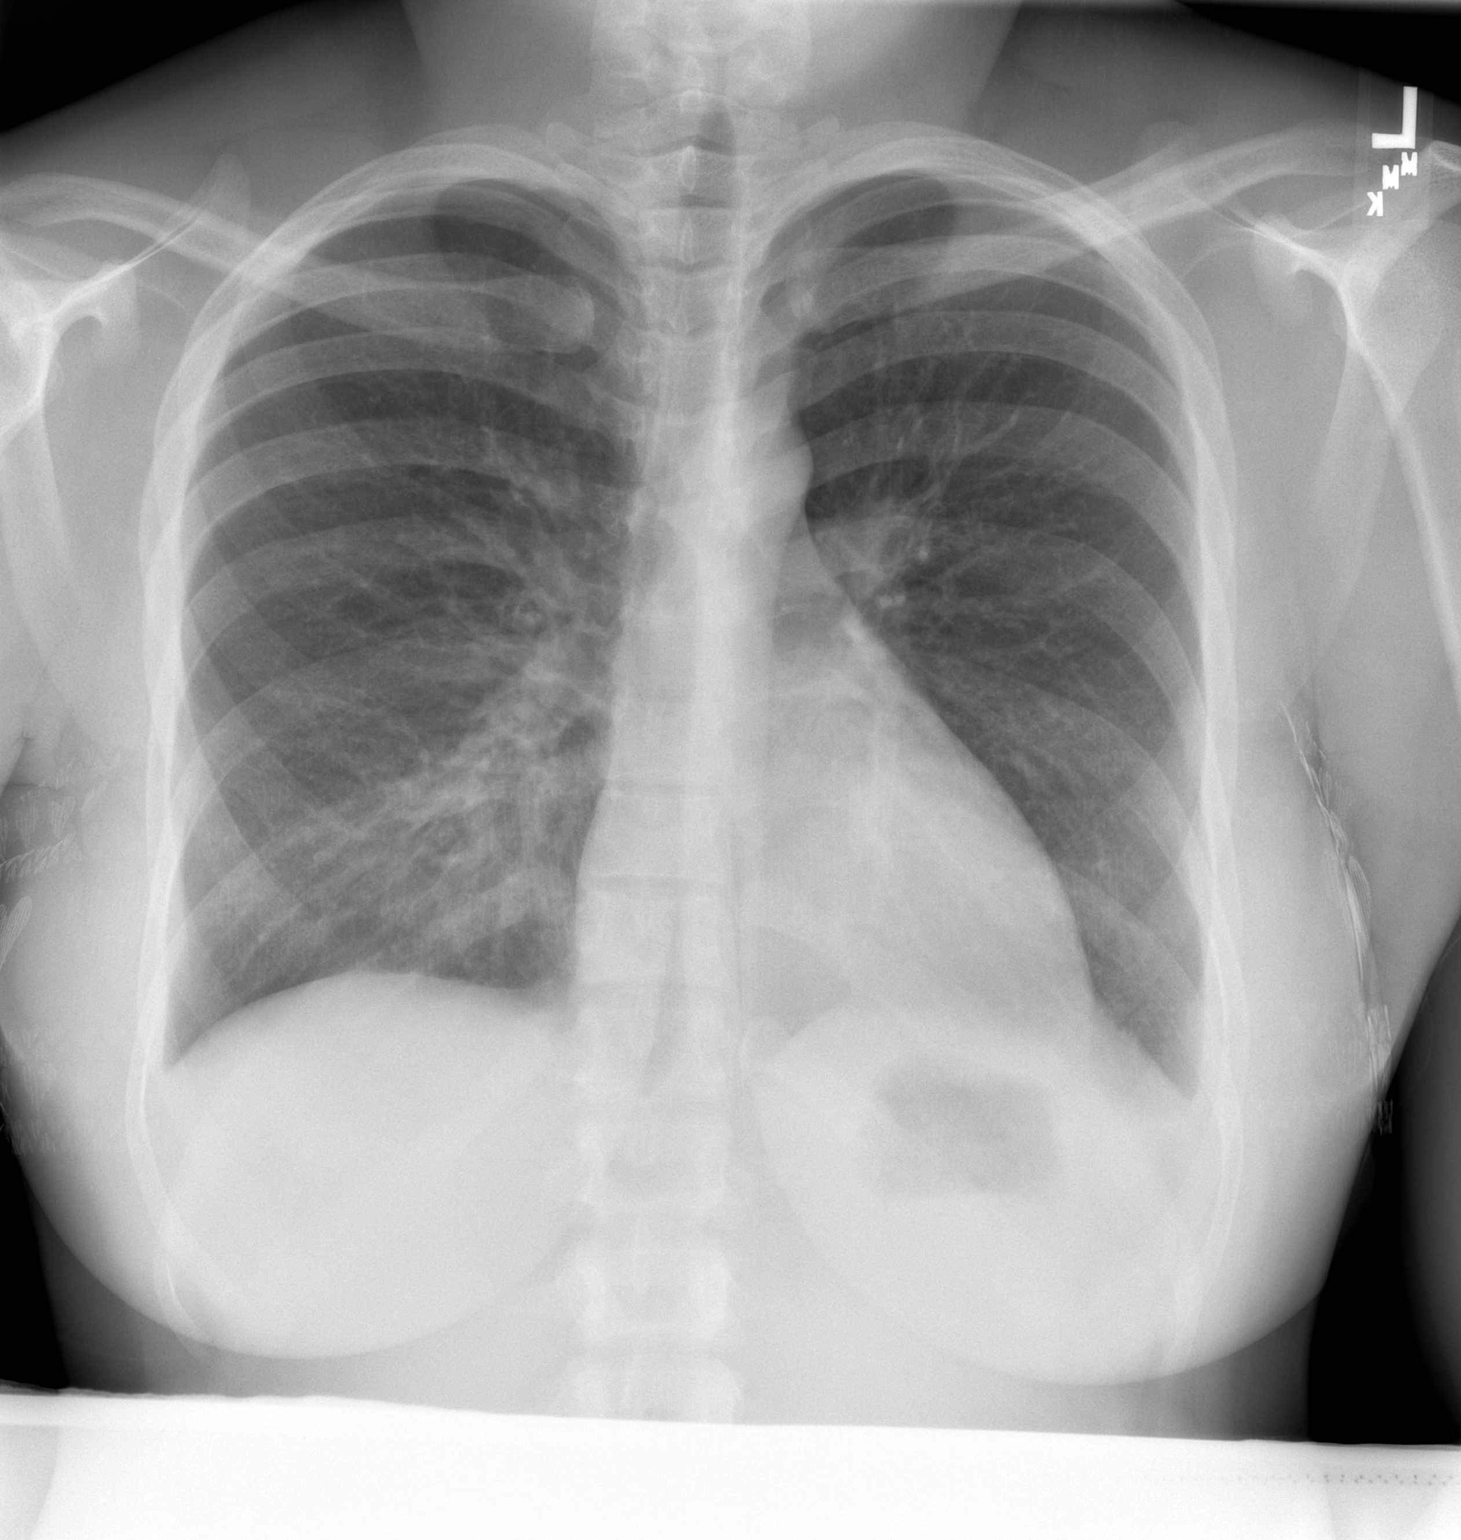

[w chest lat]
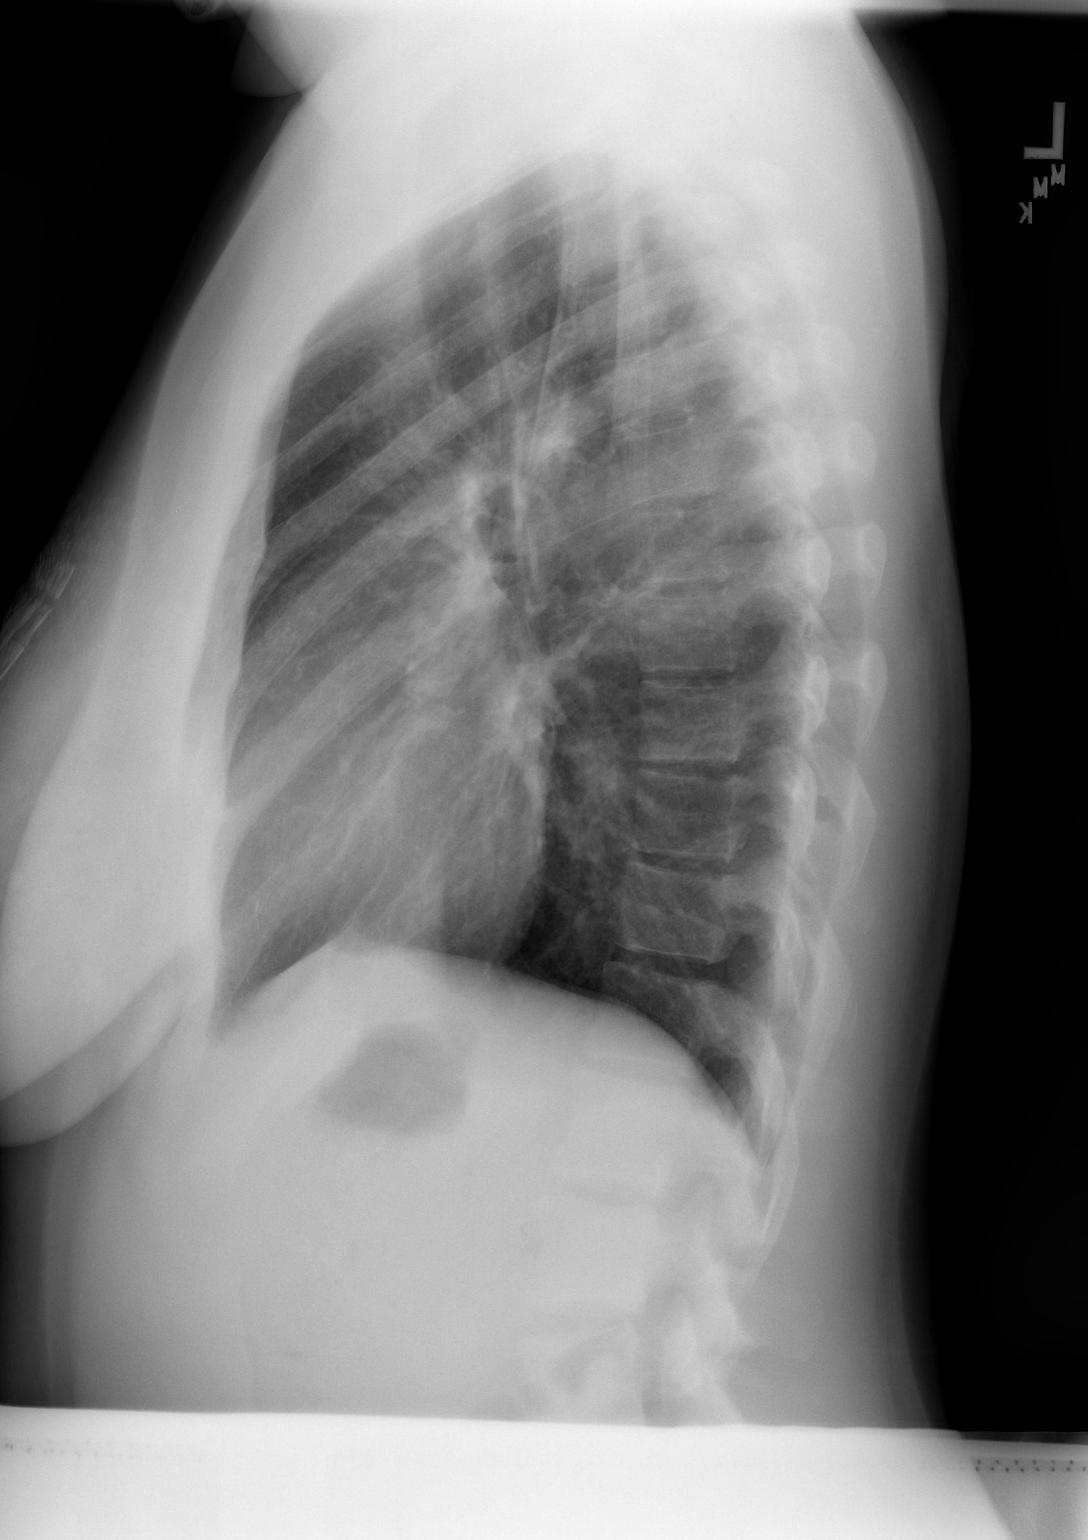

[2 of 2 positions shown; findings below may reference images not displayed]

FINDINGS: Lungs clear.  Heart size and pulmonary vascularity are
normal.  No adenopathy.  No bone lesions.
IMPRESSION: Lungs clear.

## 2016-10-19 ENCOUNTER — Emergency Department (HOSPITAL_COMMUNITY)
Admission: EM | Admit: 2016-10-19 | Discharge: 2016-10-19 | Disposition: A | Discharge status: Home / Self Care | Payer: BLUE CROSS/BLUE SHIELD | Attending: Emergency Medicine | Admitting: Emergency Medicine

## 2016-10-19 ENCOUNTER — Encounter (HOSPITAL_COMMUNITY): Payer: Self-pay

## 2016-10-19 DIAGNOSIS — K0889 Other specified disorders of teeth and supporting structures: Secondary | ICD-10-CM | POA: Insufficient documentation

## 2016-10-19 DIAGNOSIS — J45909 Unspecified asthma, uncomplicated: Secondary | ICD-10-CM | POA: Diagnosis not present

## 2016-10-19 MED ORDER — PENICILLIN V POTASSIUM 500 MG PO TABS: 500.0000 mg | ORAL_TABLET | Freq: Three times a day (TID) | ORAL | 0 refills | Status: AC

## 2016-10-19 MED ORDER — OXYCODONE-ACETAMINOPHEN 5-325 MG PO TABS: 1.0000 | ORAL_TABLET | Freq: Four times a day (QID) | ORAL | 0 refills | Status: DC | PRN

## 2016-10-19 MED ORDER — IBUPROFEN 800 MG PO TABS: 800.0000 mg | ORAL_TABLET | Freq: Three times a day (TID) | ORAL | 0 refills | Status: DC

## 2016-10-19 NOTE — ED Triage Notes (Signed)
Patient complains of dental pain to right upper tooth, states has chipped and now increased pain x 2 days. NAD

## 2016-10-19 NOTE — Discharge Instructions (Signed)
Return to the ED with any concerns including difficulty breathing or swallowing, fever/chills, decreased level of alertness/lethargy, or any other alarming sympotms

## 2016-10-19 NOTE — ED Provider Notes (Signed)
[facility] DEPT Provider Note   CSN: [id number] Arrival date & time: 10/19/16  1121   By signing my name below, I, Soijett Blue, attest that this documentation has been prepared under the direction and in the presence of Jerelyn Scott, MD. Electronically Signed: Soijett Blue, ED Scribe. 10/19/16. 12:18 PM.  History   Chief Complaint Chief Complaint  Patient presents with  . Dental Pain    HPI Tammy Torres is a 26 y.o. female who presents to the Emergency Department complaining of gradually worsening, throbbing, right upper dental pain onset 2 days ago. Pt has tried tylenol and motrin with no relief of her symptoms. She states that her right upper tooth had a dental filling in place and chipped 1 year ago. Pt reports that her right upper dental pain is worsened with temperature change. Denies having a dentist at this time. She denies fever, chills, drainage, and any other symptoms. Pt denies allergies to any medications.    The history is provided by the patient. No language interpreter was used.  Dental Pain   This is a recurrent problem. The current episode started 2 days ago. The problem occurs every several days. The problem has been gradually worsening. The pain is mild. She has tried acetaminophen (ibuprofen) for the symptoms. The treatment provided no relief.    Past Medical History:  Diagnosis Date  . Abnormal Pap smear   . Asthma   . SVD (spontaneous vaginal delivery) 06/06/2013  . UTI (urinary tract infection)     Patient Active Problem List   Diagnosis Date Noted  . Asthma, chronic 06/02/2013    Past Surgical History:  Procedure Laterality Date  . HERNIA REPAIR      OB History    Gravida Para Term Preterm AB Living   SAB TAB Ectopic Multiple Live Births           2       Home Medications    Prior to Admission medications   Medication Sig Start Date End Date Taking? Authorizing Provider  acetaminophen (TYLENOL) 325 MG tablet Take by  mouth every 6 (six) hours as needed for pain.    Historical Provider, MD  albuterol (PROAIR HFA) 108 (90 BASE) MCG/ACT inhaler Inhale 2 puffs into the lungs every 4 (four) hours as needed for wheezing or shortness of breath. 2 puffs every 4 hours as needed only  if your can't catch your breath 05/31/13   Nyoka Cowden, MD  amoxicillin-clavulanate (AUGMENTIN) 875-125 MG per tablet Take 1 tablet by mouth 2 (two) times daily. Pt started med on 05/20/13.and should take for 5days.    Historical Provider, MD  ibuprofen (ADVIL,MOTRIN) 800 MG tablet Take 1 tablet (800 mg total) by mouth 3 (three) times daily. 10/19/16   Jerelyn Scott, MD  ondansetron (ZOFRAN ODT) 8 MG disintegrating tablet Take 1 tablet (8 mg total) by mouth every 8 (eight) hours as needed for nausea. 12/01/12   Armando Reichert, CNM  oxyCODONE-acetaminophen (PERCOCET/ROXICET) 5-325 MG tablet Take 1-2 tablets by mouth every 6 (six) hours as needed for severe pain. 10/19/16   Jerelyn Scott, MD  penicillin v potassium (VEETID) 500 MG tablet Take 1 tablet (500 mg total) by mouth 3 (three) times daily. 10/19/16 10/29/16  Jerelyn Scott, MD  predniSONE (STERAPRED UNI-PAK) 10 MG tablet Prednisone 10 mg take  4 each am x 2 days,   2 each am x 2 days,  1 each am  x2days and stop 05/31/13   Nyoka Cowden, MD    Family History Family History  Problem Relation Age of Onset  . Alcohol abuse Neg Hx   . Arthritis Neg Hx   . Asthma Neg Hx   . Birth defects Neg Hx   . Cancer Neg Hx   . COPD Neg Hx   . Depression Neg Hx   . Diabetes Neg Hx   . Drug abuse Neg Hx   . Early death Neg Hx   . Hearing loss Neg Hx   . Heart disease Neg Hx   . Hyperlipidemia Neg Hx   . Hypertension Neg Hx   . Kidney disease Neg Hx   . Learning disabilities Neg Hx   . Mental illness Neg Hx   . Mental retardation Neg Hx   . Miscarriages / Stillbirths Neg Hx   . Stroke Neg Hx   . Vision loss Neg Hx     Social History Social History  Substance Use Topics  . Smoking status:  Never Smoker  . Smokeless tobacco: Not on file  . Alcohol use No     Allergies   Patient has no known allergies.   Review of Systems Review of Systems  All other systems reviewed and are negative.    Physical Exam Updated Vital Signs BP (!) 136/99   Pulse 74   Temp 98 F (36.7 C)   Resp 20   SpO2 99%   Physical Exam  Constitutional: She is oriented to person, place, and time. She appears well-developed and well-nourished. No distress.  HENT:  Head: Normocephalic and atraumatic.  Mouth/Throat: Uvula is midline, oropharynx is clear and moist and mucous membranes are normal. No trismus in the jaw. Abnormal dentition. No dental abscesses.  No gingival abscess. Right upper posterior molar with dental decay. No abscess. No trismus.   Eyes: EOM are normal.  Neck: Neck supple.  No cervical lymphadenopathy.   Cardiovascular: Normal rate.   Pulmonary/Chest: Effort normal. No respiratory distress.  Abdominal: She exhibits no distension.  Musculoskeletal: Normal range of motion.  Lymphadenopathy:    She has no cervical adenopathy.  Neurological: She is alert and oriented to person, place, and time.  Skin: Skin is warm and dry.  Psychiatric: She has a normal mood and affect. Her behavior is normal.  Nursing note and vitals reviewed.    ED Treatments / Results  DIAGNOSTIC STUDIES: Oxygen Saturation is 99% on RA, nl by my interpretation.    COORDINATION OF CARE: 12:16 PM Discussed treatment plan with pt at bedside which includes referral and follow up with dentist, abx Rx, anti-inflammatory Rx, and pt agreed to plan.   Procedures Procedures (including critical care time)  Medications Ordered in ED Medications - No data to display   Initial Impression / Assessment and Plan / ED Course  I have reviewed the triage vital signs and the nursing notes.   Pt presenting with c/o right upper molar which she chipped approx 1 year ago now with dental pain.  No sign of gingival  abscess.  No fever/chills.  Pt given penicillin, pain medication, f/u information for dentist.  Discharged with strict return precautions.  Pt agreeable with plan.  Final Clinical Impressions(s) / ED Diagnoses   Final diagnoses:  Pain, dental    New Prescriptions Discharge Medication List as of 10/19/2016 12:23 PM    START taking these medications   Details  penicillin v potassium (VEETID) 500 MG tablet Take 1 tablet (500 mg  total) by mouth 3 (three) times daily., Starting Sun 10/19/2016, Until Wed 10/29/2016, Print       I personally performed the services described in this documentation, which was scribed in my presence. The recorded information has been reviewed and is accurate.      Jerelyn Scott, MD 10/19/16 1501

## 2016-12-14 ENCOUNTER — Emergency Department (HOSPITAL_COMMUNITY)
Admission: EM | Admit: 2016-12-14 | Discharge: 2016-12-14 | Disposition: A | Discharge status: Home / Self Care | Payer: BLUE CROSS/BLUE SHIELD | Attending: Emergency Medicine | Admitting: Emergency Medicine

## 2016-12-14 ENCOUNTER — Encounter (HOSPITAL_COMMUNITY): Payer: Self-pay | Admitting: Emergency Medicine

## 2016-12-14 DIAGNOSIS — N3001 Acute cystitis with hematuria: Secondary | ICD-10-CM | POA: Diagnosis not present

## 2016-12-14 DIAGNOSIS — R1010 Upper abdominal pain, unspecified: Secondary | ICD-10-CM | POA: Diagnosis present

## 2016-12-14 DIAGNOSIS — R112 Nausea with vomiting, unspecified: Secondary | ICD-10-CM | POA: Insufficient documentation

## 2016-12-14 DIAGNOSIS — J45909 Unspecified asthma, uncomplicated: Secondary | ICD-10-CM | POA: Diagnosis not present

## 2016-12-14 DIAGNOSIS — R1013 Epigastric pain: Secondary | ICD-10-CM | POA: Insufficient documentation

## 2016-12-14 LAB — URINALYSIS, ROUTINE W REFLEX MICROSCOPIC
Bilirubin Urine: NEGATIVE
GLUCOSE, UA: NEGATIVE mg/dL
Ketones, ur: 5 mg/dL — AB
NITRITE: POSITIVE — AB
PROTEIN: 100 mg/dL — AB
Specific Gravity, Urine: 1.024 (ref 1.005–1.030)
pH: 5 (ref 5.0–8.0)

## 2016-12-14 LAB — CBC WITH DIFFERENTIAL/PLATELET
BASOS PCT: 0 %
Basophils Absolute: 0 10*3/uL (ref 0.0–0.1)
EOS ABS: 0 10*3/uL (ref 0.0–0.7)
Eosinophils Relative: 0 %
HEMATOCRIT: 42.4 % (ref 36.0–46.0)
HEMOGLOBIN: 14.2 g/dL (ref 12.0–15.0)
LYMPHS ABS: 1.3 10*3/uL (ref 0.7–4.0)
Lymphocytes Relative: 10 %
MCH: 30.1 pg (ref 26.0–34.0)
MCHC: 33.5 g/dL (ref 30.0–36.0)
MCV: 90 fL (ref 78.0–100.0)
Monocytes Absolute: 1.2 10*3/uL — ABNORMAL HIGH (ref 0.1–1.0)
Monocytes Relative: 9 %
NEUTROS ABS: 10.4 10*3/uL — AB (ref 1.7–7.7)
NEUTROS PCT: 81 %
Platelets: 164 10*3/uL (ref 150–400)
RBC: 4.71 MIL/uL (ref 3.87–5.11)
RDW: 12.1 % (ref 11.5–15.5)
WBC: 12.9 10*3/uL — AB (ref 4.0–10.5)

## 2016-12-14 LAB — COMPREHENSIVE METABOLIC PANEL
ALT: 40 U/L (ref 14–54)
ANION GAP: 8 (ref 5–15)
AST: 37 U/L (ref 15–41)
Albumin: 3.7 g/dL (ref 3.5–5.0)
Alkaline Phosphatase: 61 U/L (ref 38–126)
BILIRUBIN TOTAL: 0.8 mg/dL (ref 0.3–1.2)
BUN: 8 mg/dL (ref 6–20)
CHLORIDE: 102 mmol/L (ref 101–111)
CO2: 25 mmol/L (ref 22–32)
Calcium: 9.2 mg/dL (ref 8.9–10.3)
Creatinine, Ser: 0.85 mg/dL (ref 0.44–1.00)
Glucose, Bld: 101 mg/dL — ABNORMAL HIGH (ref 65–99)
POTASSIUM: 3.3 mmol/L — AB (ref 3.5–5.1)
Sodium: 135 mmol/L (ref 135–145)
TOTAL PROTEIN: 7.2 g/dL (ref 6.5–8.1)

## 2016-12-14 LAB — I-STAT BETA HCG BLOOD, ED (MC, WL, AP ONLY): I-stat hCG, quantitative: <5 m[IU]/mL (ref <5)

## 2016-12-14 LAB — LIPASE, BLOOD: LIPASE: 17 U/L (ref 11–51)

## 2016-12-14 MED ORDER — ONDANSETRON 4 MG PO TBDP: 4.0000 mg | ORAL_TABLET | Freq: Once | ORAL | Status: DC | PRN

## 2016-12-14 MED ORDER — DEXTROSE 5 % IV SOLN
1.0000 g | Freq: Once | INTRAVENOUS | Status: AC
  Administered 2016-12-14: 1 g via INTRAVENOUS
  Filled 2016-12-14: qty 10

## 2016-12-14 MED ORDER — MORPHINE SULFATE (PF) 4 MG/ML IV SOLN
4.0000 mg | Freq: Once | INTRAVENOUS | Status: AC
  Administered 2016-12-14: 4 mg via INTRAVENOUS
  Filled 2016-12-14: qty 1

## 2016-12-14 MED ORDER — GI COCKTAIL ~~LOC~~
30.0000 mL | Freq: Once | ORAL | Status: AC
  Administered 2016-12-14: 30 mL via ORAL
  Filled 2016-12-14: qty 30

## 2016-12-14 MED ORDER — OMEPRAZOLE 20 MG PO CPDR: 20.0000 mg | DELAYED_RELEASE_CAPSULE | Freq: Every day | ORAL | 0 refills | Status: DC

## 2016-12-14 MED ORDER — CEPHALEXIN 500 MG PO CAPS: 500.0000 mg | ORAL_CAPSULE | Freq: Two times a day (BID) | ORAL | 0 refills | Status: AC

## 2016-12-14 MED ORDER — ONDANSETRON HCL 4 MG/2ML IJ SOLN
4.0000 mg | Freq: Once | INTRAMUSCULAR | Status: AC
  Administered 2016-12-14: 4 mg via INTRAVENOUS
  Filled 2016-12-14: qty 2

## 2016-12-14 MED ORDER — SODIUM CHLORIDE 0.9 % IV BOLUS (SEPSIS)
1000.0000 mL | Freq: Once | INTRAVENOUS | Status: AC
  Administered 2016-12-14: 1000 mL via INTRAVENOUS

## 2016-12-14 NOTE — ED Triage Notes (Signed)
Pt reports 5 days of generalized abdominal pain with abdominal tightness. Denies fevers. Pt does report she has nausea and vomiting that started yesterday. Took an at home pregnancy test that was negative. Pain currently a 10/10. Last BM was last night and it was normal. LMP last night, it was irregular.

## 2016-12-14 NOTE — ED Provider Notes (Signed)
[facility]-EM[facility] DEPT Provider Note   CSN: [id number] Arrival date & time: 12/14/16  1514     History   Chief Complaint Chief Complaint  Patient presents with  . Abdominal Pain    HPI Tammy Torres is a 26 y.o. female.  HPI  Patient presents with concern of upper abdominal pain, nausea, vomiting and fever. Patient denies chronic medical issues, states that she was generally well. She does recall several acquaintances with similar illness, though of shorter duration. She states that yesterday morning, upon awakening she felt nauseous, had upper abdominal soreness. Soreness persisted, nausea persisted, and she developed increasing discomfort, subsequent had multiple episodes of vomiting. No diarrhea. There was fever yesterday, but not today. , Now after 1 full day of persistent nausea, upper abdominal pain, she presents for evaluation.   Past Medical History:  Diagnosis Date  . Abnormal Pap smear   . Asthma   . SVD (spontaneous vaginal delivery) 06/06/2013  . UTI (urinary tract infection)     Patient Active Problem List   Diagnosis Date Noted  . Asthma, chronic 06/02/2013    Past Surgical History:  Procedure Laterality Date  . HERNIA REPAIR      OB History    Gravida Para Term Preterm AB Living   2 2 2     2    SAB TAB Ectopic Multiple Live Births           2       Home Medications    Prior to Admission medications   Medication Sig Start Date End Date Taking? Authorizing Provider  acetaminophen (TYLENOL) 325 MG tablet Take by mouth every 6 (six) hours as needed for pain.    [provider]  albuterol (PROAIR HFA) 108 (90 BASE) MCG/ACT inhaler Inhale 2 puffs into the lungs every 4 (four) hours as needed for wheezing or shortness of breath. 2 puffs every 4 hours as needed only  if your can't catch your breath 05/31/13   Nyoka Cowden, MD  amoxicillin-clavulanate (AUGMENTIN) 875-125 MG per tablet Take 1 tablet by mouth 2 (two) times daily. Pt started  med on 05/20/13.and should take for 5days.    [provider]  ibuprofen (ADVIL,MOTRIN) 800 MG tablet Take 1 tablet (800 mg total) by mouth 3 (three) times daily. 10/19/16   Jerelyn Scott, MD  ondansetron (ZOFRAN ODT) 8 MG disintegrating tablet Take 1 tablet (8 mg total) by mouth every 8 (eight) hours as needed for nausea. 12/01/12   Thressa Sheller D, CNM  oxyCODONE-acetaminophen (PERCOCET/ROXICET) 5-325 MG tablet Take 1-2 tablets by mouth every 6 (six) hours as needed for severe pain. 10/19/16   Jerelyn Scott, MD  predniSONE (STERAPRED UNI-PAK) 10 MG tablet Prednisone 10 mg take  4 each am x 2 days,   2 each am x 2 days,  1 each am x2days and stop 05/31/13   Nyoka Cowden, MD    Family History Family History  Problem Relation Age of Onset  . Alcohol abuse Neg Hx   . Arthritis Neg Hx   . Asthma Neg Hx   . Birth defects Neg Hx   . Cancer Neg Hx   . COPD Neg Hx   . Depression Neg Hx   . Diabetes Neg Hx   . Drug abuse Neg Hx   . Early death Neg Hx   . Hearing loss Neg Hx   . Heart disease Neg Hx   . Hyperlipidemia Neg Hx   . Hypertension Neg Hx   .  Kidney disease Neg Hx   . Learning disabilities Neg Hx   . Mental illness Neg Hx   . Mental retardation Neg Hx   . Miscarriages / Stillbirths Neg Hx   . Stroke Neg Hx   . Vision loss Neg Hx     Social History Social History  Substance Use Topics  . Smoking status: Never Smoker  . Smokeless tobacco: Not on file  . Alcohol use No     Allergies   Patient has no known allergies.   Review of Systems Review of Systems  Constitutional:       Per HPI, otherwise negative  HENT:       Per HPI, otherwise negative  Respiratory:       Per HPI, otherwise negative  Cardiovascular:       Per HPI, otherwise negative  Gastrointestinal: Positive for abdominal pain, nausea and vomiting.  Endocrine:       Negative aside from HPI  Genitourinary:       Neg aside from HPI   Musculoskeletal:       Per HPI, otherwise negative    Skin: Negative.   Neurological: Negative for syncope.     Physical Exam Updated Vital Signs BP 133/89   Pulse 90   Temp 98.8 F (37.1 C)   Resp 18   LMP 11/13/2016   SpO2 100%   Physical Exam  Constitutional: She is oriented to person, place, and time. She appears well-developed and well-nourished. No distress.  HENT:  Head: Normocephalic and atraumatic.  Eyes: Conjunctivae and EOM are normal.  Cardiovascular: Normal rate and regular rhythm.   Pulmonary/Chest: Effort normal and breath sounds normal. No stridor. No respiratory distress.  Abdominal: She exhibits no distension.  Minimal discomfort with palpation about the epigastrium.  Musculoskeletal: She exhibits no edema.  Neurological: She is alert and oriented to person, place, and time. No cranial nerve deficit.  Skin: Skin is warm and dry.  Psychiatric: She has a normal mood and affect.  Nursing note and vitals reviewed.    ED Treatments / Results  Labs (all labs ordered are listed, but only abnormal results are displayed) Labs Reviewed  COMPREHENSIVE METABOLIC PANEL - Abnormal; Notable for the following:       Result Value   Potassium 3.3 (*)    Glucose, Bld 101 (*)    All other components within normal limits  URINALYSIS, ROUTINE W REFLEX MICROSCOPIC - Abnormal; Notable for the following:    Color, Urine AMBER (*)    APPearance CLOUDY (*)    Hgb urine dipstick LARGE (*)    Ketones, ur 5 (*)    Protein, ur 100 (*)    Nitrite POSITIVE (*)    Leukocytes, UA MODERATE (*)    Bacteria, UA RARE (*)    Squamous Epithelial / LPF 6-30 (*)    Non Squamous Epithelial 0-5 (*)    All other components within normal limits  CBC WITH DIFFERENTIAL/PLATELET - Abnormal; Notable for the following:    WBC 12.9 (*)    Neutro Abs 10.4 (*)    Monocytes Absolute 1.2 (*)    All other components within normal limits  LIPASE, BLOOD  I-STAT BETA HCG BLOOD, ED ([facility], [facility], [facility] ONLY)   Procedures Procedures (including critical care  time)  Medications Ordered in ED Medications  ondansetron (ZOFRAN-ODT) disintegrating tablet 4 mg (not administered)  sodium chloride 0.9 % bolus 1,000 mL (1,000 mLs Intravenous New Bag/Given 12/14/16 1632)  ondansetron (ZOFRAN) injection 4 mg (  4 mg Intravenous Given 12/14/16 1632)  gi cocktail (Maalox,Lidocaine,Donnatal) (30 mLs Oral Given 12/14/16 1632)  cefTRIAXone (ROCEPHIN) 1 g in dextrose 5 % 50 mL IVPB (1 g Intravenous New Bag/Given 12/14/16 1736)  morphine 4 MG/ML injection 4 mg (4 mg Intravenous Given 12/14/16 1734)     Initial Impression / Assessment and Plan / ED Course  I have reviewed the triage vital signs and the nursing notes.  Pertinent labs & imaging results that were available during my care of the patient were reviewed by me and considered in my medical decision making (see chart for details).  6:24 PM Patient awake and alert. We had a lengthy conversation about all findings including concern for urinary tract infection, mild dehydration. Labs otherwise reassuring, no evidence for pancreatitis, hepatobiliary dysfunction. Patient has received ceftriaxone, IV fluids. He was some consideration of gastritis in addition to unmask urinary tract infection, the patient will start short course of PPI therapy.    Final Clinical Impressions(s) / ED Diagnoses  Abdominal pain, epigastric Urinary tract infection   Gerhard Munch, MD 12/14/16 1825

## 2016-12-14 NOTE — Discharge Instructions (Signed)
As discussed, your evaluation today has been largely reassuring.  But, it is important that you monitor your condition carefully, and do not hesitate to return to the ED if you develop new, or concerning changes in your condition. ? ?Otherwise, please follow-up with your physician for appropriate ongoing care. ? ?

## 2017-12-04 ENCOUNTER — Encounter (HOSPITAL_COMMUNITY): Payer: Self-pay | Admitting: Emergency Medicine

## 2017-12-04 ENCOUNTER — Emergency Department (HOSPITAL_COMMUNITY)
Admission: EM | Admit: 2017-12-04 | Discharge: 2017-12-04 | Disposition: A | Discharge status: Home / Self Care | Payer: BLUE CROSS/BLUE SHIELD | Attending: Emergency Medicine | Admitting: Emergency Medicine

## 2017-12-04 DIAGNOSIS — Z5321 Procedure and treatment not carried out due to patient leaving prior to being seen by health care provider: Secondary | ICD-10-CM | POA: Insufficient documentation

## 2017-12-04 DIAGNOSIS — R109 Unspecified abdominal pain: Secondary | ICD-10-CM | POA: Insufficient documentation

## 2017-12-04 LAB — CBC
HCT: 41.9 % (ref 36.0–46.0)
Hemoglobin: 14.2 g/dL (ref 12.0–15.0)
MCH: 30.7 pg (ref 26.0–34.0)
MCHC: 33.9 g/dL (ref 30.0–36.0)
MCV: 90.7 fL (ref 78.0–100.0)
PLATELETS: 205 10*3/uL (ref 150–400)
RBC: 4.62 MIL/uL (ref 3.87–5.11)
RDW: 12.1 % (ref 11.5–15.5)
WBC: 9.1 10*3/uL (ref 4.0–10.5)

## 2017-12-04 LAB — COMPREHENSIVE METABOLIC PANEL
ALK PHOS: 62 U/L (ref 38–126)
ALT: 18 U/L (ref 14–54)
AST: 23 U/L (ref 15–41)
Albumin: 4.1 g/dL (ref 3.5–5.0)
Anion gap: 9 (ref 5–15)
BUN: 10 mg/dL (ref 6–20)
CALCIUM: 9 mg/dL (ref 8.9–10.3)
CO2: 23 mmol/L (ref 22–32)
CREATININE: 0.69 mg/dL (ref 0.44–1.00)
Chloride: 108 mmol/L (ref 101–111)
GFR calc non Af Amer: >60 mL/min (ref >60)
Glucose, Bld: 99 mg/dL (ref 65–99)
Potassium: 3.7 mmol/L (ref 3.5–5.1)
SODIUM: 140 mmol/L (ref 135–145)
Total Bilirubin: 0.7 mg/dL (ref 0.3–1.2)
Total Protein: 7.2 g/dL (ref 6.5–8.1)

## 2017-12-04 LAB — I-STAT BETA HCG BLOOD, ED (MC, WL, AP ONLY)

## 2017-12-04 LAB — LIPASE, BLOOD: Lipase: 25 U/L (ref 11–51)

## 2017-12-04 NOTE — ED Notes (Signed)
Pt took stickers to registration and told that they were leaving.

## 2017-12-04 NOTE — ED Triage Notes (Signed)
Per [facility] pt from home for abd pains with no n/v/d that started this morning when woke up. LMP last week. Vitals: 146/95, 90HR, 98%, 18R, CBG 112

## 2018-02-28 ENCOUNTER — Emergency Department (HOSPITAL_COMMUNITY)
Admission: EM | Admit: 2018-02-28 | Discharge: 2018-02-28 | Disposition: A | Discharge status: Home / Self Care | Payer: Self-pay | Attending: Emergency Medicine | Admitting: Emergency Medicine

## 2018-02-28 ENCOUNTER — Other Ambulatory Visit: Payer: Self-pay

## 2018-02-28 ENCOUNTER — Encounter (HOSPITAL_COMMUNITY): Payer: Self-pay | Admitting: Emergency Medicine

## 2018-02-28 DIAGNOSIS — B9689 Other specified bacterial agents as the cause of diseases classified elsewhere: Secondary | ICD-10-CM

## 2018-02-28 DIAGNOSIS — N390 Urinary tract infection, site not specified: Secondary | ICD-10-CM | POA: Insufficient documentation

## 2018-02-28 DIAGNOSIS — Z79899 Other long term (current) drug therapy: Secondary | ICD-10-CM | POA: Insufficient documentation

## 2018-02-28 DIAGNOSIS — A599 Trichomoniasis, unspecified: Secondary | ICD-10-CM | POA: Insufficient documentation

## 2018-02-28 DIAGNOSIS — K0889 Other specified disorders of teeth and supporting structures: Secondary | ICD-10-CM | POA: Insufficient documentation

## 2018-02-28 DIAGNOSIS — R030 Elevated blood-pressure reading, without diagnosis of hypertension: Secondary | ICD-10-CM | POA: Insufficient documentation

## 2018-02-28 DIAGNOSIS — N760 Acute vaginitis: Secondary | ICD-10-CM | POA: Insufficient documentation

## 2018-02-28 DIAGNOSIS — Z202 Contact with and (suspected) exposure to infections with a predominantly sexual mode of transmission: Secondary | ICD-10-CM

## 2018-02-28 DIAGNOSIS — J45909 Unspecified asthma, uncomplicated: Secondary | ICD-10-CM | POA: Insufficient documentation

## 2018-02-28 LAB — I-STAT BETA HCG BLOOD, ED (MC, WL, AP ONLY)

## 2018-02-28 LAB — URINALYSIS, ROUTINE W REFLEX MICROSCOPIC
Bilirubin Urine: NEGATIVE
Glucose, UA: NEGATIVE mg/dL
Ketones, ur: NEGATIVE mg/dL
Leukocytes, UA: NEGATIVE
Nitrite: POSITIVE — AB
PH: 6 (ref 5.0–8.0)
Protein, ur: NEGATIVE mg/dL
SPECIFIC GRAVITY, URINE: 1.019 (ref 1.005–1.030)

## 2018-02-28 LAB — WET PREP, GENITAL
Sperm: NONE SEEN
YEAST WET PREP: NONE SEEN

## 2018-02-28 MED ORDER — METRONIDAZOLE 500 MG PO TABS: 500.0000 mg | ORAL_TABLET | Freq: Two times a day (BID) | ORAL | 0 refills | Status: DC

## 2018-02-28 MED ORDER — METRONIDAZOLE 500 MG PO TABS: 500.0000 mg | ORAL_TABLET | Freq: Two times a day (BID) | ORAL | 0 refills | Status: AC

## 2018-02-28 MED ORDER — CEPHALEXIN 500 MG PO CAPS: 500.0000 mg | ORAL_CAPSULE | Freq: Two times a day (BID) | ORAL | 0 refills | Status: AC

## 2018-02-28 MED ORDER — AZITHROMYCIN 250 MG PO TABS
1000.0000 mg | ORAL_TABLET | Freq: Once | ORAL | Status: AC
  Administered 2018-02-28: 1000 mg via ORAL
  Filled 2018-02-28: qty 4

## 2018-02-28 MED ORDER — STERILE WATER FOR INJECTION IJ SOLN
INTRAMUSCULAR | Status: AC
  Administered 2018-02-28: 10 mL
  Filled 2018-02-28: qty 10

## 2018-02-28 MED ORDER — PENICILLIN V POTASSIUM 500 MG PO TABS: 500.0000 mg | ORAL_TABLET | Freq: Four times a day (QID) | ORAL | 0 refills | Status: DC

## 2018-02-28 MED ORDER — CEFTRIAXONE SODIUM 250 MG IJ SOLR
250.0000 mg | Freq: Once | INTRAMUSCULAR | Status: AC
  Administered 2018-02-28: 250 mg via INTRAMUSCULAR
  Filled 2018-02-28: qty 250

## 2018-02-28 NOTE — ED Triage Notes (Signed)
Pt. Stated, Tammy Torres got a cracked tooth on Friday and ai think I have I think I have vaginitis, started 4 days ago.

## 2018-02-28 NOTE — Discharge Instructions (Addendum)
You have been treated today for STD exposure with Rocephin and azithromycin.  Please be sure to wait 7 days and until symptoms resolve before resuming sexual activity.  Please be sure that all of your partners in the last 844-month are also tested and treated. Your work-up today also showed bacterial vaginosis as well as Trichomonas infection.  Please be sure to take all of your Flagyl as prescribed.  You must not drink alcohol while taking Flagyl you will become very ill and vomit if you do. You have also been diagnosed with a urinary tract infection today, please take all of your antibiotic Keflex as prescribed for this infection.  Your urine has been sent off for culture and you will be contacted by [facility] if it grows out bacteria that must require treatment for a different antibiotic. Your dental pain will also be treated with antibiotic today, we have prescribed you Keflex for this as well it is the same medication that you are taking for your UTI.  It is very important that you follow-up with a dentist as soon as possible for further treatment and evaluation of your dental pain.  A dentist is the only person who can fix this pain.  I have listed resources below for you to find a dentist. Please be sure to follow-up with your primary care provider regarding your visit today.  If you do not have a primary care provider please use the resources attached to establish one.  Your blood pressure was also elevated today, please be sure to follow-up with your primary care provider regarding this as well. Please return to the emergency department for any new or worsening symptoms or if your symptoms do not improve.  Contact a doctor if: You have back pain. You have a fever. You feel sick to your stomach (nauseous). You throw up (vomit). Your symptoms do not get better after 3 days. Your symptoms go away and then come back. Get help right away if: You have very bad back pain. You have very bad lower belly  (abdominal) pain. You are throwing up and cannot keep down any medicines or water. Contact a doctor if: Your pain is not helped with medicines. Your symptoms are worse. You have new symptoms. Get help right away if: You cannot open your mouth. You are having trouble breathing or swallowing. You have a fever. Your face, neck, or jaw is puffy (swollen).  RESOURCE GUIDE  Chronic Pain Problems: Contact [facility]  [phone] Patients need to be referred by their primary care doctor.  Insufficient Money for Medicine: Contact United Way:  call "211" or [facility] [phone].  No Primary Care Doctor: Call [facility] Connect  [phone] - can help you locate a primary care doctor that  accepts your insurance, provides certain services, etc. Physician Referral [phone]- [phone]  Agencies that provide inexpensive medical care: [facility]  [phone] [facility]  [phone] [facility]  [phone] [facility]  [phone] [facility]  [phone] [facility]  [phone]  Medicaid-accepting [facility] Providers: [facility]- [address], [address]  [phone], Mon-Fri 9am-7pm, Sat 9am-1pm [facility]- [address], [address] [place]  [phone] [facility]- [address], [address] [place]  [phone] [facility]- [address]  [phone] Renaye RakersVeita Bland- [address], [address], [phone]  Only accepts [place] patients after they have  their name  applied to their card  Self Pay (no insurance) in [facility]: Sickle Cell Patients: Dr Willey Blade, [facility]  [address] [place], [phone] [facility]- [address] [place]  [phone]       Patrcia Dolly [facility] [place]- [address], [address]       -     [facility]- see information above  (Speak to [employer] if you do not have insurance)       -  [facility]- [address] [place], [phone]       -  [facility] [place]- [address],  [phone]       -  [facility]- [address] [phone]       -  Dr Julio Sicks-  [address], [place], [phone]       -  [facility]- [address] [phone]       -  [facility]- [address], [phone], also [address], [phone]       -    [facility]- [address] [place], [phone], 1st & 3rd Saturday   every month, 10am-1pm  1) Find a Doctor and Pay Out of Pocket Although you won't have to find out who is covered by your insurance plan, it is a good idea to ask around and get recommendations. You will then need to call the office and see if the doctor you have chosen will accept you as a new patient and what types of options they offer for patients who are self-pay. Some doctors offer discounts or will set up payment plans for their patients who do not have insurance, but you will need to ask so you aren't surprised when you get to your appointment.  2) Contact Your Local Health Department Not all health departments have doctors that can see patients for sick visits, but many do, so it is worth a call to see if yours does. If you don't know where your local health department is, you can check in your phone book. The CDC also has a tool to help you locate your state's health department, and many state websites also have listings of all of their local health departments.  3) Find a Walk-in Clinic If your illness is not likely to be very severe or complicated, you may want to try a walk in clinic. These are popping up all over the country in pharmacies, drugstores, and shopping centers. They're usually staffed by nurse practitioners or physician assistants that have been trained to treat common illnesses and complaints. They're usually fairly quick and  inexpensive. However, if you have serious medical issues or chronic medical problems, these are probably not your best option  STD Testing [facility] Department of [facility], STD Clinic, [address] [place], phone [phone] or [phone].  Monday - Friday, call for an appointment. [facility] Department of [employer], STD Clinic, [address], [place], phone [phone] or [phone].  Monday - Friday, call for an appointment.  Abuse/Neglect: [facility] Child Abuse Hotline 361-358-0134 [facility] Child Abuse Hotline 902-303-9371 (After Hours)  Emergency Shelter:  [facility] [phone]  Maternity Homes: Room at the [place] [facility] [phone] [facility] Services [phone]  MRSA Hotline #:   4420733333  [facility] Resources  [facility]  United Way [facility] [address]                 [address]         [address] [place] [address]  [place]                                               [place]                              [place] Phone:  [phone]                                  Phone:  [phone]                   Phone:  [phone]  [facility], [phone] [facility] - [facility]- [phone]       -     [facility] in [place], [address],                                  [phone], [facility]  [place] Child Abuse Hotline [phone](336) [phone])[phone] or 603-035-5412(336) [phone] (After Hours)   Behavioral Health Services  Substance Abuse Resources: [facility] Drug Services  [phone] [facility] [phone] [facility] [phone] [facility] [phone] Residential & Outpatient Substance Abuse Program  [phone]  Psychological Services: [facility]  [phone] [facility]  [phone] [facility], [phone] [place], [place], ACCESS LINE: [phone] or [phone], [email or web address]  Dental Assistance  If unable to pay or uninsured, contact:  [facility] or [facility] to become qualified for the adult dental clinic.  Patients with Medicaid: [facility] [phone], [phone] [address], [phone]  If unable to pay, or uninsured, contact [facility] [phone]) or [facility] [phone] in [place], [phone] in [facility]) to become qualified for the adult dental clinic   Other Low-Cost Community Dental Services: [facility]- [address], [place], [place], [phone], [phone], [address]. [address], 2nd and 4th Thursday of the month at 6:30am.  10 clients each day by appointment, can sometimes see walk-in patients if someone does not show for an appointment. [facility]- [address], [place], [place], [phone], [phone] [facility]- [address], [place], [place], [id number], [phone] [facility]- [phone] [facility]- [phone] [facility] [phone]- [phone]

## 2018-02-28 NOTE — ED Provider Notes (Signed)
[facility] Provider Note   CSN: [id number] Arrival date & time: 02/28/18  1009     History   Chief Complaint Chief Complaint  Patient presents with  . Dental Problem  . Vaginal Discharge    HPI Tammy Torres is a 27 y.o. female presenting for 3 days of dental pain.  Patient states that on Friday her upper right molar chipped and her filling fell out.  Patient states that pain was immediate, throbbing 5/10 severity worse with chewing on that side.  Patient states that pain is constant.  Additionally patient's resents for vaginal discharge and itching.  Patient states that she is currently menstruating however is concerned because she is unsure of whether her partners have been diagnosed with STDs.  Patient states that her discharge has been bloody however thicker recently.  Patient describes her vaginal itching as constant, she is concerned today for "vaginitis".  HPI  Past Medical History:  Diagnosis Date  . Abnormal Pap smear   . Asthma   . SVD (spontaneous vaginal delivery) 06/06/2013  . UTI (urinary tract infection)     Patient Active Problem List   Diagnosis Date Noted  . Asthma, chronic 06/02/2013    Past Surgical History:  Procedure Laterality Date  . HERNIA REPAIR       OB History    Gravida  2   Para  2   Term  2   Preterm      AB      Living  2     SAB      TAB      Ectopic      Multiple      Live Births  2            Home Medications    Prior to Admission medications   Medication Sig Start Date End Date Taking? Authorizing Provider  acetaminophen (TYLENOL) 325 MG tablet Take by mouth every 6 (six) hours as needed for pain.    [provider]  albuterol (PROAIR HFA) 108 (90 BASE) MCG/ACT inhaler Inhale 2 puffs into the lungs every 4 (four) hours as needed for wheezing or shortness of breath. 2 puffs every 4 hours as needed only  if your can't catch your breath 05/31/13   Nyoka CowdenWert,  Michael B, MD  amoxicillin-clavulanate (AUGMENTIN) 875-125 MG per tablet Take 1 tablet by mouth 2 (two) times daily. Pt started med on 05/20/13.and should take for 5days.    [provider]  cephALEXin (KEFLEX) 500 MG capsule Take 1 capsule (500 mg total) by mouth 2 (two) times daily for 7 days. 02/28/18 03/07/18  Harlene SaltsMorelli, Georgette Helmer A, PA-C  ibuprofen (ADVIL,MOTRIN) 800 MG tablet Take 1 tablet (800 mg total) by mouth 3 (three) times daily. 10/19/16   Mabe, Latanya MaudlinMartha L, MD  metroNIDAZOLE (FLAGYL) 500 MG tablet Take 1 tablet (500 mg total) by mouth 2 (two) times daily for 7 days. 02/28/18 03/07/18  Harlene SaltsMorelli, Anthonella Klausner A, PA-C  omeprazole (PRILOSEC) 20 MG capsule Take 1 capsule (20 mg total) by mouth daily. Take one tablet daily 12/14/16   Gerhard MunchLockwood, Robert, MD  ondansetron (ZOFRAN ODT) 8 MG disintegrating tablet Take 1 tablet (8 mg total) by mouth every 8 (eight) hours as needed for nausea. 12/01/12   Thressa ShellerHogan, Heather D, CNM  oxyCODONE-acetaminophen (PERCOCET/ROXICET) 5-325 MG tablet Take 1-2 tablets by mouth every 6 (six) hours as needed for severe pain. 10/19/16   Mabe, Latanya MaudlinMartha L, MD  predniSONE (STERAPRED UNI-PAK) 10 MG  tablet Prednisone 10 mg take  4 each am x 2 days,   2 each am x 2 days,  1 each am x2days and stop 05/31/13   Nyoka Cowden, MD    Family History Family History  Problem Relation Age of Onset  . Alcohol abuse Neg Hx   . Arthritis Neg Hx   . Asthma Neg Hx   . Birth defects Neg Hx   . Cancer Neg Hx   . COPD Neg Hx   . Depression Neg Hx   . Diabetes Neg Hx   . Drug abuse Neg Hx   . Early death Neg Hx   . Hearing loss Neg Hx   . Heart disease Neg Hx   . Hyperlipidemia Neg Hx   . Hypertension Neg Hx   . Kidney disease Neg Hx   . Learning disabilities Neg Hx   . Mental illness Neg Hx   . Mental retardation Neg Hx   . Miscarriages / Stillbirths Neg Hx   . Stroke Neg Hx   . Vision loss Neg Hx     Social History Social History   Tobacco Use  . Smoking status: Never Smoker  .  Smokeless tobacco: Never Used  Substance Use Topics  . Alcohol use: No  . Drug use: No     Allergies   Patient has no known allergies.   Review of Systems Review of Systems  Constitutional: Negative.  Negative for chills, fatigue and fever.  HENT: Positive for dental problem. Negative for drooling, facial swelling, rhinorrhea, sore throat, trouble swallowing and voice change.   Eyes: Negative.  Negative for visual disturbance.  Respiratory: Negative.  Negative for cough and shortness of breath.   Cardiovascular: Negative.  Negative for chest pain.  Gastrointestinal: Negative.  Negative for abdominal pain, blood in stool, diarrhea, nausea and vomiting.  Genitourinary: Positive for vaginal bleeding and vaginal discharge. Negative for difficulty urinating, dysuria, flank pain, hematuria and pelvic pain.  Musculoskeletal: Negative.  Negative for arthralgias and myalgias.  Skin: Negative.  Negative for rash.  Neurological: Negative.  Negative for dizziness, weakness and headaches.     Physical Exam Updated Vital Signs BP (!) 141/108 (BP Location: Left Arm)   Pulse 60   Temp 98.8 F (37.1 C) (Oral)   Resp 14   Ht 5\' 5"  (1.651 m)   Wt 59 kg   LMP 02/24/2018   SpO2 100%   BMI 21.63 kg/m   Physical Exam  Constitutional: She is oriented to person, place, and time. She appears well-developed and well-nourished. No distress.  HENT:  Head: Normocephalic and atraumatic.  Right Ear: External ear normal.  Left Ear: External ear normal.  Nose: Nose normal.  Mouth/Throat: Uvula is midline, oropharynx is clear and moist and mucous membranes are normal. No trismus in the jaw. Dental caries present. No dental abscesses or uvula swelling. No oropharyngeal exudate, posterior oropharyngeal edema, posterior oropharyngeal erythema or tonsillar abscesses.    Multiple dental caries present. Tooth #3, upper right side is cracked.  No gingival erythema, swelling or discharge noted suggesting  dental abscess. No signs suggesting peritonsillar abscess. No signs suggestive of Ludwig's angina. No signs suggestive of retropharyngeal abscess.  Eyes: Pupils are equal, round, and reactive to light. EOM are normal.  Neck: Trachea normal and normal range of motion. Neck supple. No tracheal deviation present.  Cardiovascular: Normal rate, regular rhythm, normal heart sounds and intact distal pulses.  Pulmonary/Chest: Effort normal. No respiratory distress.  Abdominal: Soft. Bowel  sounds are normal. There is no tenderness. There is no rigidity, no rebound, no guarding, no CVA tenderness, no tenderness at McBurney's point and negative Murphy's sign.  Genitourinary: Uterus normal. Uterus is not tender. Cervix exhibits no motion tenderness, no discharge and no friability. Right adnexum displays no tenderness. Left adnexum displays no tenderness. There is bleeding in the vagina. No tenderness in the vagina.  Genitourinary Comments: Exam chaperoned by Signa KellSarah Bertrand RN.  Pelvic exam: normal external genitalia without evidence of trauma. VULVA: normal appearing vulva with no masses, tenderness or lesion. VAGINA: normal appearing vagina with normal color and discharge, no lesions. CERVIX: normal appearing cervix without lesions, cervical motion tenderness absent, cervical os closed without purulent discharge;   Scant amount of vaginal bleeding from cervical os present.  Wet prep and DNA probe for chlamydia and GC obtained.   ADNEXA: normal adnexa in size, nontender and no masses UTERUS: uterus is normal size, shape, consistency and nontender.   Musculoskeletal: Normal range of motion.  Neurological: She is alert and oriented to person, place, and time.  Skin: Skin is warm and dry.  Psychiatric: She has a normal mood and affect. Her behavior is normal.     ED Treatments / Results  Labs (all labs ordered are listed, but only abnormal results are displayed) Labs Reviewed  WET PREP, GENITAL -  Abnormal; Notable for the following components:      Result Value   Trich, Wet Prep PRESENT (*)    Clue Cells Wet Prep HPF POC PRESENT (*)    WBC, Wet Prep HPF POC FEW (*)    All other components within normal limits  URINALYSIS, ROUTINE W REFLEX MICROSCOPIC - Abnormal; Notable for the following components:   Hgb urine dipstick LARGE (*)    Nitrite POSITIVE (*)    Bacteria, UA MANY (*)    All other components within normal limits  URINE CULTURE  RPR  HIV ANTIBODY (ROUTINE TESTING)  I-STAT BETA HCG BLOOD, ED ([facility], [facility], [facility] ONLY)  GC/CHLAMYDIA PROBE AMP ([place] NOT [facility]    EKG None  Radiology No results found.  Procedures Procedures (including critical care time)  Medications Ordered in ED Medications  cefTRIAXone (ROCEPHIN) injection 250 mg (250 mg Intramuscular Given 02/28/18 1431)  azithromycin (ZITHROMAX) tablet 1,000 mg (1,000 mg Oral Given 02/28/18 1430)  sterile water (preservative free) injection (10 mLs  Given 02/28/18 1431)     Initial Impression / Assessment and Plan / ED Course  I have reviewed the triage vital signs and the nursing notes.  Pertinent labs & imaging results that were available during my care of the patient were reviewed by me and considered in my medical decision making (see chart for details).  Clinical Course as of Feb 28 2134  Wynelle LinkSun Feb 28, 2018  1441 Patient denies breast feeding.   [BM]    Clinical Course User Index [BM] Bill SalinasMorelli, Lamees Gable A, PA-C   Patient with dental pain. With upper right infected dental surface cavity noted, without signs or symptoms of dental abscess, no swelling/erythema/tenderness of the gums.  Patient is well-appearing, afebrile, nontoxic, speaking well.  Patient able to swallow without pain.  No signs of swelling or concern for Ludwig's angina/Peritonsilar abscess/Retropharyngeal abscess or other deep tissue infections.  No sign of swelling of the neck, patient has good range of motion of the neck, no  trismus. Due to patient's current urinary tract infection patient will be treated with Keflex for both her dental pain and UTI.  This was discussed with Dr. Estell Harpin who agrees that this is appropriate treatment at this time.  Patient encouraged to follow-up with dentist as soon as possible for further treatment.  Dental resources given.  Patient presents with concerns for possible STD. Patient understands that they have GC/Chlamydia cultures pending and that they will need to inform all sexual partners if results return positive. Patient has been treated prophylactically with azithromycin and Rocephin due to patient's history, pelvic exam, and wet prep with increased WBCs. Pt not concerning for PID because hemodynamically stable and no cervical motion tenderness on pelvic exam. Patient has also been treated with Flagyl for Bacterial Vaginosis and Trichomoniasis.  Both of these are treated with Flagyl, the 2 g of Flagyl was not given today because she will repeat receiving 500 mg of Flagyl twice a day for the next 7 days.  This was also discussed with Dr. Estell Harpin who agrees with appropriateness of treatment. Patient has been advised to not drink alcohol while on this medication. Patient to be discharged with instructions to follow up with OBGYN/PCP. Discussed importance of using protection when sexually active.   The patient was noted to have elevated BP in ED today. No known diagnosis of HTN.  It is possible that blood pressure is elevated due to pain.  Patient denying symptoms suggestive of hypertension emergency including chest pain, shortness of breath, visual changes, decreased urine or headache. I instructed the patient to followup with their PCP within 1 week for BP check. I also counseled the patient regarding the signs and symptoms which would require an emergent visit to an emergency department for hypertensive urgency and/or hypertensive emergency.  At this time there does not appear to be any  evidence of an acute emergency medical condition and the patient appears stable for discharge with appropriate outpatient follow up. Diagnosis was discussed with patient who verbalizes understanding of care plan and is agreeable to discharge. I have discussed return precautions with patient who verbalizes understanding of return precautions. Patient strongly encouraged to follow-up with their PCP. All questions answered.  Patient's case discussed with Dr. Estell Harpin who agrees with plan to discharge with follow-up.     Note: Portions of this report may have been transcribed using voice recognition software. Every effort was made to ensure accuracy; however, inadvertent computerized transcription errors may still be present.  Final Clinical Impressions(s) / ED Diagnoses   Final diagnoses:  Pain, dental  Exposure to sexually transmitted disease (STD)  BV (bacterial vaginosis)  Trichomoniasis  Lower urinary tract infectious disease  Elevated blood pressure reading    ED Discharge Orders         Ordered    metroNIDAZOLE (FLAGYL) 500 MG tablet  2 times daily,   Status:  Discontinued     02/28/18 1441    penicillin v potassium (VEETID) 500 MG tablet  4 times daily,   Status:  Discontinued     02/28/18 1441    cephALEXin (KEFLEX) 500 MG capsule  2 times daily     02/28/18 1444    metroNIDAZOLE (FLAGYL) 500 MG tablet  2 times daily     02/28/18 1444           Elizabeth Palau 02/28/18 2148    Bethann Berkshire, MD 03/02/18 1218

## 2018-02-28 NOTE — ED Notes (Signed)
No Answer from lobby

## 2018-03-01 LAB — GC/CHLAMYDIA PROBE AMP (~~LOC~~) NOT AT ARMC
CHLAMYDIA, DNA PROBE: NEGATIVE
NEISSERIA GONORRHEA: NEGATIVE

## 2018-03-01 LAB — RPR: RPR Ser Ql: NONREACTIVE

## 2018-03-02 LAB — URINE CULTURE: Culture: >=100000 — AB

## 2018-03-02 LAB — HIV ANTIBODY (ROUTINE TESTING W REFLEX): HIV SCREEN 4TH GENERATION: NONREACTIVE

## 2018-03-03 ENCOUNTER — Telehealth: Payer: Self-pay | Admitting: *Deleted

## 2018-03-03 NOTE — Telephone Encounter (Signed)
Post ED Visit - Positive Culture Follow-up  Culture report reviewed by antimicrobial stewardship pharmacist:  []  Enzo BiNathan Batchelder, Pharm.D. []  Celedonio MiyamotoJeremy Frens, Pharm.D., BCPS AQ-ID []  Garvin FilaMike Maccia, Pharm.D., BCPS []  Georgina PillionElizabeth Martin, [address]Ph[address]D., BCPS []  [place] AgencyMinh Pham, [address]Ph[address]D., BCPS, AAHIVP []  Estella HuskMichelle Turner, Pharm.D., BCPS, AAHIVP [x]  Lysle Pearlachel Rumbarger, PharmD, BCPS []  Phillips Climeshuy Dang, PharmD, BCPS []  Agapito GamesAlison Masters, PharmD, BCPS []  Verlan FriendsErin Deja, PharmD  Positive urine culture Treated with cephalexin, organism sensitive to the same and no further patient follow-up is required at this time.  Virl AxeRobertson, Kiannah Grunow [facility] 03/03/2018, 11:38 AM

## 2018-06-28 ENCOUNTER — Ambulatory Visit (INDEPENDENT_AMBULATORY_CARE_PROVIDER_SITE_OTHER): Payer: Self-pay | Admitting: General Practice

## 2018-06-28 ENCOUNTER — Encounter: Payer: Self-pay | Admitting: Family Medicine

## 2018-06-28 DIAGNOSIS — Z3201 Encounter for pregnancy test, result positive: Secondary | ICD-10-CM

## 2018-06-28 DIAGNOSIS — Z3A01 Less than 8 weeks gestation of pregnancy: Secondary | ICD-10-CM

## 2018-06-28 LAB — POCT PREGNANCY, URINE: Preg Test, Ur: POSITIVE — AB

## 2018-06-28 MED ORDER — PRENATAL PLUS 27-1 MG PO TABS
1.0000 | ORAL_TABLET | Freq: Every day | ORAL | 0 refills | Status: DC
Start: 2018-06-28 — End: 2018-07-22

## 2018-06-28 NOTE — Progress Notes (Signed)
I have reviewed the chart and agree with nursing staff's documentation of this patient's encounter.  Thressa ShellerHeather Abdirizak Richison, CNM 06/28/2018 2:43 PM

## 2018-06-28 NOTE — Progress Notes (Signed)
Patient presents to office today for UPT. UPT +. Patient reports first positive home test two days ago. LMP 05/28/18 EDD 03/04/19 [id number]. Patient denies taking any meds/vitamins- requests Rx for PNV. PNV Rx sent in per protocol. Patient to begin prenatal care.   Chase Callerarrie H RN BSN 06/28/18

## 2018-06-29 LAB — POCT PREGNANCY, URINE: PREG TEST UR: POSITIVE — AB

## 2018-07-14 NOTE — L&D Delivery Note (Signed)
Delivery Note   Caidynce, Muzyka Girl Delorise [[id number]]  At 3:28 PM a viable and healthy female was delivered via Vaginal, Spontaneous (Presentation: occiput; posterior ).  APGAR: 3, 7; weight  .   Placenta status: spontaneous, intact.  Cord: 3V  with the following complications: .  Anesthesia:  Epidural  Episiotomy:   NOne Lacerations:  None Suture Repair: NA Est. Blood Loss (mL):  350    Zurie, Platas [[id number]]  At 3:34 PM a viable and healthy female was delivered via Vaginal, Kiwi Vacuum (Extractor) (Presentation: occiput posterior;  ).  APGAR: 3, 7; weight  .   Placenta status: spontaneous, intact.  Cord: 3V with the following complications: .  After Baby A was delivery, baby b was found to be in vertex presentation. FHR could not be auscultated. BSUS showed no heart beat visualized, therefore the decision was made to expedite delivery with the application of the vacuum. With 1 pop off, the baby delivered to crowning. Baby cried vigorously after delivery  Anesthesia:  Epidural  Episiotomy:  none Lacerations:  none Suture Repair: NA Est. Blood Loss (mL):  350   Mom to postpartum.   Baby A to Couplet care / Skin to Skin.   Baby B to Couplet care / Skin to Skin.  Vanessa Kick 01/26/2019, 3:51 PM

## 2018-07-22 ENCOUNTER — Emergency Department (HOSPITAL_COMMUNITY): Payer: Medicaid Other

## 2018-07-22 ENCOUNTER — Encounter (HOSPITAL_COMMUNITY): Payer: Self-pay

## 2018-07-22 ENCOUNTER — Emergency Department (HOSPITAL_COMMUNITY)
Admission: EM | Admit: 2018-07-22 | Discharge: 2018-07-22 | Disposition: A | Discharge status: Home / Self Care | Payer: Medicaid Other | Attending: Emergency Medicine | Admitting: Emergency Medicine

## 2018-07-22 DIAGNOSIS — F17210 Nicotine dependence, cigarettes, uncomplicated: Secondary | ICD-10-CM | POA: Diagnosis not present

## 2018-07-22 DIAGNOSIS — Z3A08 8 weeks gestation of pregnancy: Secondary | ICD-10-CM | POA: Diagnosis not present

## 2018-07-22 DIAGNOSIS — O99331 Smoking (tobacco) complicating pregnancy, first trimester: Secondary | ICD-10-CM | POA: Diagnosis not present

## 2018-07-22 DIAGNOSIS — O30041 Twin pregnancy, dichorionic/diamniotic, first trimester: Secondary | ICD-10-CM | POA: Diagnosis present

## 2018-07-22 DIAGNOSIS — O30001 Twin pregnancy, unspecified number of placenta and unspecified number of amniotic sacs, first trimester: Secondary | ICD-10-CM

## 2018-07-22 DIAGNOSIS — Z79899 Other long term (current) drug therapy: Secondary | ICD-10-CM | POA: Insufficient documentation

## 2018-07-22 LAB — COMPREHENSIVE METABOLIC PANEL
ALBUMIN: 3.6 g/dL (ref 3.5–5.0)
ALK PHOS: 42 U/L (ref 38–126)
ALT: 10 U/L (ref 0–44)
ANION GAP: 7 (ref 5–15)
AST: 16 U/L (ref 15–41)
BILIRUBIN TOTAL: 0.3 mg/dL (ref 0.3–1.2)
CALCIUM: 9 mg/dL (ref 8.9–10.3)
CO2: 23 mmol/L (ref 22–32)
CREATININE: 0.64 mg/dL (ref 0.44–1.00)
Chloride: 105 mmol/L (ref 98–111)
GFR calc Af Amer: >60 mL/min (ref >60)
GFR calc non Af Amer: >60 mL/min (ref >60)
GLUCOSE: 83 mg/dL (ref 70–99)
Potassium: 3.7 mmol/L (ref 3.5–5.1)
Sodium: 135 mmol/L (ref 135–145)
TOTAL PROTEIN: 7.1 g/dL (ref 6.5–8.1)

## 2018-07-22 LAB — I-STAT BETA HCG BLOOD, ED (MC, WL, AP ONLY)

## 2018-07-22 LAB — CBC
HCT: 41.5 % (ref 36.0–46.0)
Hemoglobin: 13.9 g/dL (ref 12.0–15.0)
MCH: 30.5 pg (ref 26.0–34.0)
MCHC: 33.5 g/dL (ref 30.0–36.0)
MCV: 91 fL (ref 80.0–100.0)
PLATELETS: 230 10*3/uL (ref 150–400)
RBC: 4.56 MIL/uL (ref 3.87–5.11)
RDW: 11.7 % (ref 11.5–15.5)
WBC: 9.3 10*3/uL (ref 4.0–10.5)
nRBC: 0 % (ref 0.0–0.2)

## 2018-07-22 LAB — LIPASE, BLOOD: Lipase: 27 U/L (ref 11–51)

## 2018-07-22 MED ORDER — PRENATAL COMPLETE 14-0.4 MG PO TABS: ORAL_TABLET | ORAL | 2 refills | Status: DC

## 2018-07-22 NOTE — ED Notes (Signed)
Patient verbalizes understanding of discharge instructions. Opportunity for questioning and answers were provided. Armband removed by staff, pt discharged from ED.  

## 2018-07-22 NOTE — Discharge Instructions (Addendum)
Schedule prenatal care.  Go to [facility] hospital if any problems

## 2018-07-22 NOTE — ED Provider Notes (Addendum)
[facility] EMERGENCY DEPARTMENT Provider Note   CSN: [id number] Arrival date & time: 07/22/18  0932     History   Chief Complaint Chief Complaint  Patient presents with  . Emesis    HPI Tammy Torres is a 28 y.o. female.  The history is provided by the patient. No language interpreter was used.  Emesis  Severity:  Moderate Timing:  Constant Able to tolerate:  Liquids Chronicity:  New Recent urination:  Normal Relieved by:  Nothing Worsened by:  Nothing Ineffective treatments:  None tried Associated symptoms: abdominal pain   Risk factors: pregnant   Pt complains of some soreness in her right lower abdomen   Past Medical History:  Diagnosis Date  . Abnormal Pap smear   . Asthma   . SVD (spontaneous vaginal delivery) 06/06/2013  . UTI (urinary tract infection)     Patient Active Problem List   Diagnosis Date Noted  . Asthma, chronic 06/02/2013    Past Surgical History:  Procedure Laterality Date  . HERNIA REPAIR       OB History    Gravida  2   Para  2   Term  2   Preterm      AB      Living  2     SAB      TAB      Ectopic      Multiple      Live Births  2            Home Medications    Prior to Admission medications   Medication Sig Start Date End Date Taking? Authorizing Provider  Acetaminophen 500 MG coapsule Take 1,000 mg by mouth every 6 (six) hours as needed for mild pain or moderate pain.    Yes [provider]  albuterol (PROAIR HFA) 108 (90 BASE) MCG/ACT inhaler Inhale 2 puffs into the lungs every 4 (four) hours as needed for wheezing or shortness of breath. 2 puffs every 4 hours as needed only  if your can't catch your breath 05/31/13  Yes Nyoka Cowden, MD  ibuprofen (ADVIL,MOTRIN) 800 MG tablet Take 1 tablet (800 mg total) by mouth 3 (three) times daily. Patient not taking: Reported on 07/22/2018 10/19/16   Phillis Haggis, MD  omeprazole (PRILOSEC) 20 MG capsule Take 1 capsule (20 mg total)  by mouth daily. Take one tablet daily Patient not taking: Reported on 07/22/2018 12/14/16   Gerhard Munch, MD  ondansetron (ZOFRAN ODT) 8 MG disintegrating tablet Take 1 tablet (8 mg total) by mouth every 8 (eight) hours as needed for nausea. Patient not taking: Reported on 07/22/2018 12/01/12   Thressa Sheller D, CNM  oxyCODONE-acetaminophen (PERCOCET/ROXICET) 5-325 MG tablet Take 1-2 tablets by mouth every 6 (six) hours as needed for severe pain. Patient not taking: Reported on 07/22/2018 10/19/16   Mabe, Latanya Maudlin, MD  predniSONE (STERAPRED UNI-PAK) 10 MG tablet Prednisone 10 mg take  4 each am x 2 days,   2 each am x 2 days,  1 each am x2days and stop Patient not taking: Reported on 07/22/2018 05/31/13   Nyoka Cowden, MD  prenatal vitamin w/FE, FA (PRENATAL 1 + 1) 27-1 MG TABS tablet Take 1 tablet by mouth daily at 12 noon. Patient not taking: Reported on 07/22/2018 06/28/18   Armando Reichert, CNM    Family History Family History  Problem Relation Age of Onset  . Alcohol abuse Neg Hx   . Arthritis Neg Hx   .  Asthma Neg Hx   . Birth defects Neg Hx   . Cancer Neg Hx   . COPD Neg Hx   . Depression Neg Hx   . Diabetes Neg Hx   . Drug abuse Neg Hx   . Early death Neg Hx   . Hearing loss Neg Hx   . Heart disease Neg Hx   . Hyperlipidemia Neg Hx   . Hypertension Neg Hx   . Kidney disease Neg Hx   . Learning disabilities Neg Hx   . Mental illness Neg Hx   . Mental retardation Neg Hx   . Miscarriages / Stillbirths Neg Hx   . Stroke Neg Hx   . Vision loss Neg Hx     Social History Social History   Tobacco Use  . Smoking status: Current Every Day Smoker    Packs/day: 0.50    Types: Cigarettes  . Smokeless tobacco: Never Used  Substance Use Topics  . Alcohol use: No  . Drug use: No     Allergies   Patient has no known allergies.   Review of Systems Review of Systems  Gastrointestinal: Positive for abdominal pain and vomiting.  All other systems reviewed and are  negative.    Physical Exam Updated Vital Signs BP 117/79 (BP Location: Right Arm)   Pulse 80   Temp 98.1 F (36.7 C) (Oral)   Resp 16   LMP 06/21/2018 (Approximate)   SpO2 100%   Physical Exam Vitals signs and nursing note reviewed.  HENT:     Head: Normocephalic.     Left Ear: Tympanic membrane normal.     Nose: Nose normal.  Eyes:     Pupils: Pupils are equal, round, and reactive to light.  Neck:     Musculoskeletal: Normal range of motion.  Cardiovascular:     Rate and Rhythm: Normal rate.     Pulses: Normal pulses.  Pulmonary:     Effort: Pulmonary effort is normal.  Abdominal:     General: Abdomen is flat.  Musculoskeletal: Normal range of motion.  Skin:    General: Skin is warm.  Neurological:     General: No focal deficit present.     Mental Status: She is alert.  Psychiatric:        Mood and Affect: Mood normal.      ED Treatments / Results  Labs (all labs ordered are listed, but only abnormal results are displayed) Labs Reviewed  COMPREHENSIVE METABOLIC PANEL - Abnormal; Notable for the following components:      Result Value   BUN <5 (*)    All other components within normal limits  I-STAT BETA HCG BLOOD, ED ([facility], [facility], [facility] ONLY) - Abnormal; Notable for the following components:   I-stat hCG, quantitative >2,000.0 (*)    All other components within normal limits  LIPASE, BLOOD  CBC  URINALYSIS, ROUTINE W REFLEX MICROSCOPIC    EKG None  Radiology [place] Ob Comp Less 14 Wks  Result Date: 07/22/2018 CLINICAL DATA:  Pelvic pain for a few days. Pregnant patient. Quantitative beta HCG level greater than 2000. EXAM: TRIPLET OBSTETRIC <14WK ULTRASOUND AND TRANSVAGINAL OB US COMPARISON:  None. FINDINGS: Number of IUPs:  2 TWIN 1 Yolk sac:  Visualized. Embryo:  Visualized. Cardiac Activity: Visualized. Heart Rate: 168 bpm CRL: 20.6 mm.  8w 5d TWIN 2 Yolk sac:  Visualized. Embryo:  Visualized. Cardiac Activity: Visualized. Heart Rate: 165 bpm CRL: 19.9 mm 8w 4d  Subchorionic hemorrhage:  Small subarachnoid hemorrhage.  Maternal uterus/adnexae: No uterine masses. Cervix is closed. Ovaries and adnexa are unremarkable. No abnormal pelvic free fluid. IMPRESSION: 1. Live twin intrauterine pregnancy, which appears diamniotic and dichorionic. Small subarachnoid hemorrhage. Symmetric sizes of the embryos, measured ages of 8 weeks and 5 days and 8 weeks and 4 days. No other pregnancy complication. 2. No ovarian or adnexal abnormality. No abnormal pelvic free fluid. Electronically Signed   By: Amie Portland M.D.   On: 07/22/2018 13:50   US Ob Transvaginal  Result Date: 07/22/2018 CLINICAL DATA:  Pelvic pain for a few days. Pregnant patient. Quantitative beta HCG level greater than 2000. EXAM: TRIPLET OBSTETRIC <14WK ULTRASOUND AND TRANSVAGINAL OB US COMPARISON:  None. FINDINGS: Number of IUPs:  2 TWIN 1 Yolk sac:  Visualized. Embryo:  Visualized. Cardiac Activity: Visualized. Heart Rate: 168 bpm CRL: 20.6 mm.  8w 5d TWIN 2 Yolk sac:  Visualized. Embryo:  Visualized. Cardiac Activity: Visualized. Heart Rate: 165 bpm CRL: 19.9 mm 8w 4d Subchorionic hemorrhage:  Small subarachnoid hemorrhage. Maternal uterus/adnexae: No uterine masses. Cervix is closed. Ovaries and adnexa are unremarkable. No abnormal pelvic free fluid. IMPRESSION: 1. Live twin intrauterine pregnancy, which appears diamniotic and dichorionic. Small subarachnoid hemorrhage. Symmetric sizes of the embryos, measured ages of 8 weeks and 5 days and 8 weeks and 4 days. No other pregnancy complication. 2. No ovarian or adnexal abnormality. No abnormal pelvic free fluid. Electronically Signed   By: Amie Portland M.D.   On: 07/22/2018 13:50    Procedures Procedures (including critical care time)  Medications Ordered in ED Medications - No data to display   Initial Impression / Assessment and Plan / ED Course  I have reviewed the triage vital signs and the nursing notes.  Pertinent labs & imaging results that were  available during my care of the patient were reviewed by me and considered in my medical decision making (see chart for details).       Final Clinical Impressions(s) / ED Diagnoses   Final diagnoses:  Twin gestation in first trimester, unspecified multiple gestation type    ED Discharge Orders         Ordered    Prenatal Vit-Fe Fumarate-FA (PRENATAL COMPLETE) 14-0.4 MG TABS     07/22/18 1445        An After Visit Summary was printed and given to the patient.    Elson Areas, PA-C 07/22/18 1445    Elson Areas, PA-C 07/22/18 1457    Alvira Monday, MD 07/23/18 1029

## 2018-07-22 NOTE — ED Notes (Signed)
Patient transported to Ultrasound 

## 2018-07-22 NOTE — ED Triage Notes (Signed)
Pt endorses RLQ pain with vomiting x 3 days and vaginal irritation. Endorses unprotected sex. Unable to keep food or fluids down. VSS.

## 2018-07-31 ENCOUNTER — Encounter (HOSPITAL_COMMUNITY): Payer: Self-pay

## 2018-07-31 ENCOUNTER — Inpatient Hospital Stay (HOSPITAL_COMMUNITY)
Admission: AD | Admit: 2018-07-31 | Discharge: 2018-07-31 | Disposition: A | Discharge status: Home / Self Care | Payer: Medicaid Other | Source: Ambulatory Visit | Attending: Obstetrics and Gynecology | Admitting: Obstetrics and Gynecology

## 2018-07-31 DIAGNOSIS — O210 Mild hyperemesis gravidarum: Secondary | ICD-10-CM | POA: Insufficient documentation

## 2018-07-31 DIAGNOSIS — O219 Vomiting of pregnancy, unspecified: Secondary | ICD-10-CM

## 2018-07-31 DIAGNOSIS — IMO0001 Reserved for inherently not codable concepts without codable children: Secondary | ICD-10-CM

## 2018-07-31 DIAGNOSIS — O418X10 Other specified disorders of amniotic fluid and membranes, first trimester, not applicable or unspecified: Secondary | ICD-10-CM

## 2018-07-31 DIAGNOSIS — O209 Hemorrhage in early pregnancy, unspecified: Secondary | ICD-10-CM | POA: Insufficient documentation

## 2018-07-31 DIAGNOSIS — O26891 Other specified pregnancy related conditions, first trimester: Secondary | ICD-10-CM

## 2018-07-31 DIAGNOSIS — O2341 Unspecified infection of urinary tract in pregnancy, first trimester: Secondary | ICD-10-CM | POA: Diagnosis not present

## 2018-07-31 DIAGNOSIS — Z87891 Personal history of nicotine dependence: Secondary | ICD-10-CM | POA: Diagnosis not present

## 2018-07-31 DIAGNOSIS — O30041 Twin pregnancy, dichorionic/diamniotic, first trimester: Secondary | ICD-10-CM | POA: Diagnosis not present

## 2018-07-31 DIAGNOSIS — O468X1 Other antepartum hemorrhage, first trimester: Secondary | ICD-10-CM | POA: Diagnosis not present

## 2018-07-31 DIAGNOSIS — Z3A10 10 weeks gestation of pregnancy: Secondary | ICD-10-CM | POA: Insufficient documentation

## 2018-07-31 DIAGNOSIS — R109 Unspecified abdominal pain: Secondary | ICD-10-CM

## 2018-07-31 LAB — WET PREP, GENITAL
Sperm: NONE SEEN
Trich, Wet Prep: NONE SEEN
Yeast Wet Prep HPF POC: NONE SEEN

## 2018-07-31 LAB — URINALYSIS, ROUTINE W REFLEX MICROSCOPIC
Bilirubin Urine: NEGATIVE
GLUCOSE, UA: NEGATIVE mg/dL
KETONES UR: NEGATIVE mg/dL
NITRITE: POSITIVE — AB
PH: 6 (ref 5.0–8.0)
PROTEIN: 30 mg/dL — AB
Specific Gravity, Urine: 1.024 (ref 1.005–1.030)

## 2018-07-31 MED ORDER — PROMETHAZINE HCL 25 MG PO TABS: 25.0000 mg | ORAL_TABLET | Freq: Every evening | ORAL | 0 refills | Status: DC | PRN

## 2018-07-31 MED ORDER — ONDANSETRON 8 MG PO TBDP
8.0000 mg | ORAL_TABLET | Freq: Once | ORAL | Status: AC
  Administered 2018-07-31: 8 mg via ORAL
  Filled 2018-07-31: qty 1

## 2018-07-31 MED ORDER — ONDANSETRON 8 MG PO TBDP: 8.0000 mg | ORAL_TABLET | Freq: Three times a day (TID) | ORAL | 0 refills | Status: DC | PRN

## 2018-07-31 MED ORDER — METOCLOPRAMIDE HCL 10 MG PO TABS: 10.0000 mg | ORAL_TABLET | Freq: Three times a day (TID) | ORAL | 0 refills | Status: DC

## 2018-07-31 MED ORDER — CEPHALEXIN 500 MG PO CAPS: 500.0000 mg | ORAL_CAPSULE | Freq: Four times a day (QID) | ORAL | 0 refills | Status: DC

## 2018-07-31 NOTE — Discharge Instructions (Signed)
Subchorionic Hematoma ° °A subchorionic hematoma is a gathering of blood between the outer wall of the embryo (chorion) and the inner wall of the womb (uterus). °This condition can cause vaginal bleeding. If they cause little or no vaginal bleeding, early small hematomas usually shrink on their own and do not affect your baby or pregnancy. When bleeding starts later in pregnancy, or if the hematoma is larger or occurs in older pregnant women, the condition may be more serious. Larger hematomas may get bigger, which increases the chances of miscarriage. This condition also increases the risk of: °· Premature separation of the placenta from the uterus. °· Premature (preterm) labor. °· Stillbirth. °What are the causes? °The exact cause of this condition is not known. It occurs when blood is trapped between the placenta and the uterine wall because the placenta has separated from the original site of implantation. °What increases the risk? °You are more likely to develop this condition if: °· You were treated with fertility medicines. °· You conceived through in vitro fertilization (IVF). °What are the signs or symptoms? °Symptoms of this condition include: °· Vaginal spotting or bleeding. °· Contractions of the uterus. These cause abdominal pain. °Sometimes you may have no symptoms and the bleeding may only be seen when ultrasound images are taken (transvaginal ultrasound). °How is this diagnosed? °This condition is diagnosed based on a physical exam. This includes a pelvic exam. You may also have other tests, including: °· Blood tests. °· Urine tests. °· Ultrasound of the abdomen. °How is this treated? °Treatment for this condition can vary. Treatment may include: °· Watchful waiting. You will be monitored closely for any changes in bleeding. During this stage: °? The hematoma may be reabsorbed by the body. °? The hematoma may separate the fluid-filled space containing the embryo (gestational sac) from the wall of the  womb (endometrium). °· Medicines. °· Activity restriction. This may be needed until the bleeding stops. °Follow these instructions at home: °· Stay on bed rest if told to do so by your health care provider. °· Do not lift anything that is heavier than 10 lbs. (4.5 kg) or as told by your health care provider. °· Do not use any products that contain nicotine or tobacco, such as cigarettes and e-cigarettes. If you need help quitting, ask your health care provider. °· Track and write down the number of pads you use each day and how soaked (saturated) they are. °· Do not use tampons. °· Keep all follow-up visits as told by your health care provider. This is important. Your health care provider may ask you to have follow-up blood tests or ultrasound tests or both. °Contact a health care provider if: °· You have any vaginal bleeding. °· You have a fever. °Get help right away if: °· You have severe cramps in your stomach, back, abdomen, or pelvis. °· You pass large clots or tissue. Save any tissue for your health care provider to look at. °· You have more vaginal bleeding, and you faint or become lightheaded or weak. °Summary °· A subchorionic hematoma is a gathering of blood between the outer wall of the placenta and the uterus. °· This condition can cause vaginal bleeding. °· Sometimes you may have no symptoms and the bleeding may only be seen when ultrasound images are taken. °· Treatment may include watchful waiting, medicines, or activity restriction. °This information is not intended to replace advice given to you by your health care provider. Make sure you discuss any questions you   have with your health care provider. °Document Released: 10/15/2006 Document Revised: 08/26/2016 Document Reviewed: 08/26/2016 °Elsevier Interactive Patient Education © 2019 [employer] ° °

## 2018-07-31 NOTE — MAU Note (Signed)
Tammy Torres is a 28 y.o. at [redacted]w[redacted]d here in MAU reporting:  +vaginal bleeding. +Lower abdominal cramping. Like a period. intermittent +emesis. States has had 5 episodes of vomiting in the past 24 hours. endorses having trouble eating. States cant eat a full meal. Feels dehydrated. +vaginal irritation. White thick discharge. Odor.  Onset of complaint:  Pain score: 7-8/10. Vitals:   07/31/18 1623  BP: 124/87  Pulse: 78  Resp: 17  Temp: 97.7 F (36.5 C)  SpO2: 98%    Lab orders placed from triage: ua

## 2018-07-31 NOTE — MAU Provider Note (Signed)
History     CSN: [id number]  Arrival date and time: 07/31/18 1600   First Provider Initiated Contact with Patient 07/31/18 1648      Chief Complaint  Patient presents with  . Vaginal Bleeding  . Abdominal Pain  . Emesis  . Vaginal Discharge   HPI  [name] is a 28 y.o. female [id number] @ [redacted]w[redacted]d Di/Di twins here with bleeding and pain which is not new. She was seen a few weeks ago at the ED and was diagnosed with a subchorionic hemorrhage on [place]. She has continued to noticed spotting/bleeding only when she wipes. The pain pain is all over her abdomen. The pain comes and goes. She has not tried taking tylenol or ibuprofen. She also attests to N/V which has been going on for 1 month. She is trying to eat small meals which is not working. She tried ginger drops which is not helping.    OB History    Gravida  3   Para  2   Term  2   Preterm      AB      Living  2     SAB      TAB      Ectopic      Multiple      Live Births  2           Past Medical History:  Diagnosis Date  . Abnormal Pap smear   . Asthma   . SVD (spontaneous vaginal delivery) 06/06/2013  . UTI (urinary tract infection)     Past Surgical History:  Procedure Laterality Date  . HERNIA REPAIR      Family History  Problem Relation Age of Onset  . Alcohol abuse Neg Hx   . Arthritis Neg Hx   . Asthma Neg Hx   . Birth defects Neg Hx   . Cancer Neg Hx   . COPD Neg Hx   . Depression Neg Hx   . Diabetes Neg Hx   . Drug abuse Neg Hx   . Early death Neg Hx   . Hearing loss Neg Hx   . Heart disease Neg Hx   . Hyperlipidemia Neg Hx   . Hypertension Neg Hx   . Kidney disease Neg Hx   . Learning disabilities Neg Hx   . Mental illness Neg Hx   . Mental retardation Neg Hx   . Miscarriages / Stillbirths Neg Hx   . Stroke Neg Hx   . Vision loss Neg Hx     Social History   Tobacco Use  . Smoking status: Former Smoker    Packs/day: 0.50    Types: Cigarettes  . Smokeless  tobacco: Never Used  . Tobacco comment: quit 2 years ago  Substance Use Topics  . Alcohol use: No  . Drug use: No    Allergies: No Known Allergies  Medications Prior to Admission  Medication Sig Dispense Refill Last Dose  . Acetaminophen 500 MG coapsule Take 1,000 mg by mouth every 6 (six) hours as needed for mild pain or moderate pain.    07/21/2018 at prn  . albuterol (PROAIR HFA) 108 (90 BASE) MCG/ACT inhaler Inhale 2 puffs into the lungs every 4 (four) hours as needed for wheezing or shortness of breath. 2 puffs every 4 hours as needed only  if your can't catch your breath 1 Inhaler 2 unknown at prn  . Prenatal Vit-Fe Fumarate-FA (PRENATAL COMPLETE) 14-0.4 MG TABS One a day 60 each 2  Results for orders placed or performed during the hospital encounter of 07/31/18 (from the past 48 hour(s))  Urinalysis, Routine w reflex microscopic     Status: Abnormal   Collection Time: 07/31/18  4:26 PM  Result Value Ref Range   Color, Urine AMBER (A) YELLOW    Comment: BIOCHEMICALS MAY BE AFFECTED BY COLOR   APPearance CLOUDY (A) CLEAR   Specific Gravity, Urine 1.024 1.005 - 1.030   pH 6.0 5.0 - 8.0   Glucose, UA NEGATIVE NEGATIVE mg/dL   Hgb urine dipstick MODERATE (A) NEGATIVE   Bilirubin Urine NEGATIVE NEGATIVE   Ketones, ur NEGATIVE NEGATIVE mg/dL   Protein, ur 30 (A) NEGATIVE mg/dL   Nitrite POSITIVE (A) NEGATIVE   Leukocytes, UA LARGE (A) NEGATIVE   RBC / HPF >50 (H) 0 - 5 RBC/hpf   WBC, UA >50 (H) 0 - 5 WBC/hpf   Bacteria, UA MANY (A) NONE SEEN   Squamous Epithelial / LPF 11-20 0 - 5   Mucus PRESENT     Comment: Performed at [facility], [address] [place], [place] [address]  Wet prep, genital     Status: Abnormal   Collection Time: 07/31/18  5:02 PM  Result Value Ref Range   Yeast Wet Prep HPF POC NONE SEEN NONE SEEN   Trich, Wet Prep NONE SEEN NONE SEEN   Clue Cells Wet Prep HPF POC PRESENT (A) NONE SEEN   WBC, Wet Prep HPF POC MODERATE (A) NONE SEEN    Comment:  MANY BACTERIA SEEN   Sperm NONE SEEN     Comment: Performed at [facility], [address] [place], [place] [address]   Review of Systems  Constitutional: Negative for fever.  Gastrointestinal: Positive for abdominal pain, diarrhea and vomiting.  Genitourinary: Positive for dysuria and urgency. Negative for flank pain.   Physical Exam   Blood pressure 124/87, pulse 78, temperature 97.7 F (36.5 C), temperature source Oral, resp. rate 17, weight 59 kg, last menstrual period 06/21/2018, SpO2 98 %, unknown if currently breastfeeding.  Physical Exam  Constitutional: She is oriented to person, place, and time. She appears well-developed and well-nourished.  HENT:  Head: Normocephalic.  Eyes: Pupils are equal, round, and reactive to light.  Respiratory: Effort normal.  GI: Soft. She exhibits no distension. There is no abdominal tenderness. There is no rebound and no guarding.  Genitourinary:    Genitourinary Comments: No blood noted on pad Wet prep and GC collected by RN without speculum    Musculoskeletal: Normal range of motion.  Neurological: She is alert and oriented to person, place, and time.  Skin: Skin is warm.  Psychiatric: Her behavior is normal.    MAU Course  Procedures   Pt informed that the ultrasound is considered a limited OB ultrasound and is not intended to be a complete ultrasound exam.  Patient also informed that the ultrasound is not being completed with the intent of assessing for fetal or placental anomalies or any pelvic abnormalities.  Explained that the purpose of today's ultrasound is to assess for  viability.  Patient acknowledges the purpose of the exam and the limitations of the study.    Di/Di twins with + fetal heart tones of both gestations.   MDM  A positive blood type  Urine culture pending Patient has failed multiple N/V remedies.  I discussed with the patient the risk of zofran use in the first trimester of pregnancy. Risks of use in the  first trimester specifically during organogenesis  include cleft lip/ palate in infants. Patient is now 10 weeks and past organogenesis; she is agreeable to trying Zofran for her symptoms.   Patient feeling much better, tolerating oral fluid intake.  Assessment and Plan   A:  1. Nausea and vomiting in pregnancy   2. Subchorionic hemorrhage of placenta in first trimester, single or unspecified fetus   3. Dichorionic diamniotic twin pregnancy in first trimester   4. Abdominal pain in pregnancy, first trimester   5. UTI in pregnancy, antepartum, first trimester     P:  Discharge home in stable condition Rx: Keflex, Phenergan PRN bedtime, Reglan TID, Zofran PRN Urine culture pending Start prenatal care Prenatal vitamins daily Return to MAU if symptoms worsen Pelvic rest Bleeding precautions  Rasch, Harolyn Rutherford, NP 08/01/2018 10:58 AM

## 2018-08-02 LAB — GC/CHLAMYDIA PROBE AMP (~~LOC~~) NOT AT ARMC
Chlamydia: NEGATIVE
Neisseria Gonorrhea: NEGATIVE

## 2018-08-22 ENCOUNTER — Encounter (HOSPITAL_COMMUNITY): Payer: Self-pay | Admitting: *Deleted

## 2018-08-22 ENCOUNTER — Inpatient Hospital Stay (HOSPITAL_COMMUNITY)
Admission: AD | Admit: 2018-08-22 | Discharge: 2018-08-22 | Disposition: A | Discharge status: Home / Self Care | Payer: Medicaid Other | Attending: Obstetrics and Gynecology | Admitting: Obstetrics and Gynecology

## 2018-08-22 ENCOUNTER — Other Ambulatory Visit: Payer: Self-pay

## 2018-08-22 DIAGNOSIS — O30041 Twin pregnancy, dichorionic/diamniotic, first trimester: Secondary | ICD-10-CM | POA: Diagnosis not present

## 2018-08-22 DIAGNOSIS — Z87891 Personal history of nicotine dependence: Secondary | ICD-10-CM | POA: Insufficient documentation

## 2018-08-22 DIAGNOSIS — O209 Hemorrhage in early pregnancy, unspecified: Secondary | ICD-10-CM | POA: Diagnosis not present

## 2018-08-22 DIAGNOSIS — Z3A13 13 weeks gestation of pregnancy: Secondary | ICD-10-CM | POA: Diagnosis not present

## 2018-08-22 NOTE — MAU Note (Addendum)
Pt states bleeding started early yesterday with spotting but by the end of the day she had soaked a pad and then woke up this morning in a 'puddle'.  Pt states pregnant with twins, this nurse able to doppler 1 heart rate at this time

## 2018-08-22 NOTE — Discharge Instructions (Signed)
Vaginal Bleeding During Pregnancy, First Trimester ° °A small amount of bleeding (spotting) from the vagina is common during early pregnancy. Sometimes the bleeding is normal and does not cause problems. At other times, though, bleeding may be a sign of something serious. Tell your doctor about any bleeding from your vagina right away. °Follow these instructions at home: °Activity °· Follow your doctor's instructions about how active you can be. °· If needed, make plans for someone to help with your normal activities. °· Do not have sex or orgasms until your doctor says that this is safe. °General instructions °· Take over-the-counter and prescription medicines only as told by your doctor. °· Watch your condition for any changes. °· Write down: °? The number of pads you use each day. °? How often you change pads. °? How soaked (saturated) your pads are. °· Do not use tampons. °· Do not douche. °· If you pass any tissue from your vagina, save it to show to your doctor. °· Keep all follow-up visits as told by your doctor. This is important. °Contact a doctor if: °· You have vaginal bleeding at any time while you are pregnant. °· You have cramps. °· You have a fever. °Get help right away if: °· You have very bad cramps in your back or belly (abdomen). °· You pass large clots or a lot of tissue from your vagina. °· Your bleeding gets worse. °· You feel light-headed. °· You feel weak. °· You pass out (faint). °· You have chills. °· You are leaking fluid from your vagina. °· You have a gush of fluid from your vagina. °Summary °· Sometimes vaginal bleeding during pregnancy is normal and does not cause problems. At other times, bleeding may be a sign of something serious. °· Tell your doctor about any bleeding from your vagina right away. °· Follow your doctor's instructions about how active you can be. You may need someone to help you with your normal activities. °This information is not intended to replace advice given to  you by your health care provider. Make sure you discuss any questions you have with your health care provider. °Document Released: 11/14/2013 Document Revised: 10/01/2016 Document Reviewed: 10/01/2016 °Elsevier Interactive Patient Education © 2019 [employer] ° ° °Subchorionic Hematoma ° °A subchorionic hematoma is a gathering of blood between the outer wall of the embryo (chorion) and the inner wall of the womb (uterus). °This condition can cause vaginal bleeding. If they cause little or no vaginal bleeding, early small hematomas usually shrink on their own and do not affect your baby or pregnancy. When bleeding starts later in pregnancy, or if the hematoma is larger or occurs in older pregnant women, the condition may be more serious. Larger hematomas may get bigger, which increases the chances of miscarriage. This condition also increases the risk of: °· Premature separation of the placenta from the uterus. °· Premature (preterm) labor. °· Stillbirth. °What are the causes? °The exact cause of this condition is not known. It occurs when blood is trapped between the placenta and the uterine wall because the placenta has separated from the original site of implantation. °What increases the risk? °You are more likely to develop this condition if: °· You were treated with fertility medicines. °· You conceived through in vitro fertilization (IVF). °What are the signs or symptoms? °Symptoms of this condition include: °· Vaginal spotting or bleeding. °· Contractions of the uterus. These cause abdominal pain. °Sometimes you may have no symptoms and the bleeding may   only be seen when ultrasound images are taken (transvaginal ultrasound). °How is this diagnosed? °This condition is diagnosed based on a physical exam. This includes a pelvic exam. You may also have other tests, including: °· Blood tests. °· Urine tests. °· Ultrasound of the abdomen. °How is this treated? °Treatment for this condition can vary. Treatment may  include: °· Watchful waiting. You will be monitored closely for any changes in bleeding. During this stage: °? The hematoma may be reabsorbed by the body. °? The hematoma may separate the fluid-filled space containing the embryo (gestational sac) from the wall of the womb (endometrium). °· Medicines. °· Activity restriction. This may be needed until the bleeding stops. °Follow these instructions at home: °· Stay on bed rest if told to do so by your health care provider. °· Do not lift anything that is heavier than 10 lbs. (4.5 kg) or as told by your health care provider. °· Do not use any products that contain nicotine or tobacco, such as cigarettes and e-cigarettes. If you need help quitting, ask your health care provider. °· Track and write down the number of pads you use each day and how soaked (saturated) they are. °· Do not use tampons. °· Keep all follow-up visits as told by your health care provider. This is important. Your health care provider may ask you to have follow-up blood tests or ultrasound tests or both. °Contact a health care provider if: °· You have any vaginal bleeding. °· You have a fever. °Get help right away if: °· You have severe cramps in your stomach, back, abdomen, or pelvis. °· You pass large clots or tissue. Save any tissue for your health care provider to look at. °· You have more vaginal bleeding, and you faint or become lightheaded or weak. °Summary °· A subchorionic hematoma is a gathering of blood between the outer wall of the placenta and the uterus. °· This condition can cause vaginal bleeding. °· Sometimes you may have no symptoms and the bleeding may only be seen when ultrasound images are taken. °· Treatment may include watchful waiting, medicines, or activity restriction. °This information is not intended to replace advice given to you by your health care provider. Make sure you discuss any questions you have with your health care provider. °Document Released: 10/15/2006  Document Revised: 08/26/2016 Document Reviewed: 08/26/2016 °Elsevier Interactive Patient Education © 2019 [employer] Inc[employer] ° °

## 2018-08-22 NOTE — MAU Provider Note (Signed)
Chief Complaint: Vaginal Bleeding and Abdominal Pain   First Provider Initiated Contact with Patient 08/22/18 0954     SUBJECTIVE HPI: Tammy Torres is a 28 y.o. G3P2002 at [redacted]w[redacted]d with di/di twins who presents to Maternity Admissions reporting vaginal bleeding. Had an ultrasound last month that showed a [facility]. Bleeding started this weekend. Has some spotting then got heavier. Not soaking pads or passing clots. Some lower abdominal cramping once she arrived to MAU. No recent intercourse. Has first prenatal appointment at [facility] ob/gyn on Tuesday.  Location: lower abdomen Quality: cramping Severity: 6/10 on pain scale Duration: <2 hours Timing: intermittent Modifying factors: none Associated signs and symptoms: vaginal bleeding  Past Medical History:  Diagnosis Date  . Abnormal Pap smear   . Asthma   . SVD (spontaneous vaginal delivery) 06/06/2013  . UTI (urinary tract infection)    OB History  Gravida Para Term Preterm AB Living  3 2 2     2   SAB TAB Ectopic Multiple Live Births          2    # Outcome Date GA Lbr Len/2nd Weight Sex Delivery Anes PTL Lv  3 Current           2 Term 06/06/13 [redacted]w[redacted]d 04:24 / 00:11 3419 g F Vag-Spont EPI  LIV  1 Term 04/10/10 [redacted]w[redacted]d 36:00 3544 g F Vag-Spont  N LIV   Past Surgical History:  Procedure Laterality Date  . HERNIA REPAIR     Social History   Socioeconomic History  . Marital status: Single    Spouse name: Not on file  . Number of children: Not on file  . Years of education: Not on file  . Highest education level: Not on file  Occupational History  . Not on file  Social Needs  . Financial resource strain: Not on file  . Food insecurity:    Worry: Not on file    Inability: Not on file  . Transportation needs:    Medical: Not on file    Non-medical: Not on file  Tobacco Use  . Smoking status: Former Smoker    Packs/day: 0.50    Types: Cigarettes  . Smokeless tobacco: Never Used  . Tobacco comment: quit 2 years ago   Substance and Sexual Activity  . Alcohol use: No  . Drug use: Yes    Types: Marijuana  . Sexual activity: Not Currently    Birth control/protection: None  Lifestyle  . Physical activity:    Days per week: Not on file    Minutes per session: Not on file  . Stress: Not on file  Relationships  . Social connections:    Talks on phone: Not on file    Gets together: Not on file    Attends religious service: Not on file    Active member of club or organization: Not on file    Attends meetings of clubs or organizations: Not on file    Relationship status: Not on file  . Intimate partner violence:    Fear of current or ex partner: Not on file    Emotionally abused: Not on file    Physically abused: Not on file    Forced sexual activity: Not on file  Other Topics Concern  . Not on file  Social History Narrative  . Not on file   Family History  Problem Relation Age of Onset  . Alcohol abuse Neg Hx   . Arthritis Neg Hx   . Asthma Neg  Hx   . Birth defects Neg Hx   . Cancer Neg Hx   . COPD Neg Hx   . Depression Neg Hx   . Diabetes Neg Hx   . Drug abuse Neg Hx   . Early death Neg Hx   . Hearing loss Neg Hx   . Heart disease Neg Hx   . Hyperlipidemia Neg Hx   . Hypertension Neg Hx   . Kidney disease Neg Hx   . Learning disabilities Neg Hx   . Mental illness Neg Hx   . Mental retardation Neg Hx   . Miscarriages / Stillbirths Neg Hx   . Stroke Neg Hx   . Vision loss Neg Hx    No current facility-administered medications on file prior to encounter.    Current Outpatient Medications on File Prior to Encounter  Medication Sig Dispense Refill  . Acetaminophen 500 MG coapsule Take 1,000 mg by mouth every 6 (six) hours as needed for mild pain or moderate pain.     Marland Kitchen metoCLOPramide (REGLAN) 10 MG tablet Take 1 tablet (10 mg total) by mouth 3 (three) times daily before meals. 30 tablet 0  . ondansetron (ZOFRAN ODT) 8 MG disintegrating tablet Take 1 tablet (8 mg total) by mouth  every 8 (eight) hours as needed for nausea or vomiting. 20 tablet 0  . Prenatal Vit-Fe Fumarate-FA (PRENATAL COMPLETE) 14-0.4 MG TABS One a day 60 each 2  . promethazine (PHENERGAN) 25 MG tablet Take 1 tablet (25 mg total) by mouth at bedtime as needed for nausea or vomiting. 30 tablet 0  . albuterol (PROAIR HFA) 108 (90 BASE) MCG/ACT inhaler Inhale 2 puffs into the lungs every 4 (four) hours as needed for wheezing or shortness of breath. 2 puffs every 4 hours as needed only  if your can't catch your breath 1 Inhaler 2   No Known Allergies  I have reviewed patient's Past Medical Hx, Surgical Hx, Family Hx, Social Hx, medications and allergies.   Review of Systems  Constitutional: Negative.   Gastrointestinal: Positive for abdominal pain. Negative for diarrhea, nausea and vomiting.  Genitourinary: Positive for vaginal bleeding. Negative for dysuria and vaginal discharge.    OBJECTIVE Patient Vitals for the past 24 hrs:  BP Temp Temp src Pulse Resp SpO2 Weight  08/22/18 0933 (!) 98/53 97.8 F (36.6 C) Oral 89 16 100 % -  08/22/18 0917 - - - - - - 61 kg   Constitutional: Well-developed, well-nourished female in no acute distress.  Cardiovascular: normal rate & rhythm, no murmur Respiratory: normal rate and effort. Lung sounds clear throughout GI: Abd soft, non-tender, Pos BS x 4. No guarding or rebound tenderness MS: Extremities nontender, no edema, normal ROM Neurologic: Alert and oriented x 4.  GU: small amount of brown/pink blood on exam. Cervix closed/thick  Pt informed that the ultrasound is considered a limited OB ultrasound and is not intended to be a complete ultrasound exam.  Patient also informed that the ultrasound is not being completed with the intent of assessing for fetal or placental anomalies or any pelvic abnormalities.  Explained that the purpose of today's ultrasound is to assess for  viability.  Patient acknowledges the purpose of the exam and the limitations of the  study.   Live IUP x 2. FHR present 152/160    LAB RESULTS No results found for this or any previous visit (from the past 24 hour(s)).  IMAGING No results found.  MAU COURSE Orders Placed This Encounter  Procedures  . Discharge patient   No orders of the defined types were placed in this encounter.   MDM RN only able to doppler 1 fetus. Bedside ultrasound performed (see note above). Live fetus x2 seen on ultrasound.  Minimal amount of bleeding on exam & cervix closed. Rh positive. [facility]  Patient reassured.   ASSESSMENT 1. Vaginal bleeding in pregnancy, first trimester   2. [redacted] weeks gestation of pregnancy   3. Dichorionic diamniotic twin pregnancy in first trimester     PLAN Discharge home in stable condition. bleeding precautions Follow-up Information    [facility]   Contact information: [address] [place] [id number] [phone]          Allergies as of 08/22/2018   No Known Allergies     Medication List    STOP taking these medications   cephALEXin 500 MG capsule Commonly known as:  KEFLEX     TAKE these medications   Acetaminophen 500 MG coapsule Take 1,000 mg by mouth every 6 (six) hours as needed for mild pain or moderate pain.   albuterol 108 (90 Base) MCG/ACT inhaler Commonly known as:  PROAIR HFA Inhale 2 puffs into the lungs every 4 (four) hours as needed for wheezing or shortness of breath. 2 puffs every 4 hours as needed only  if your can't catch your breath   metoCLOPramide 10 MG tablet Commonly known as:  REGLAN Take 1 tablet (10 mg total) by mouth 3 (three) times daily before meals.   ondansetron 8 MG disintegrating tablet Commonly known as:  ZOFRAN ODT Take 1 tablet (8 mg total) by mouth every 8 (eight) hours as needed for nausea or vomiting.   PRENATAL COMPLETE 14-0.4 MG Tabs One a day   promethazine 25 MG tablet Commonly known as:  PHENERGAN Take 1 tablet (25 mg total) by  mouth at bedtime as needed for nausea or vomiting.        Judeth HornLawrence, Glyndon Tursi, NP 08/22/2018  7:11 PM

## 2018-11-29 ENCOUNTER — Encounter (HOSPITAL_COMMUNITY): Payer: Self-pay

## 2018-11-29 ENCOUNTER — Inpatient Hospital Stay (HOSPITAL_COMMUNITY)
Admission: AD | Admit: 2018-11-29 | Discharge: 2018-11-29 | Disposition: A | Discharge status: Home / Self Care | Payer: Medicaid Other | Attending: Obstetrics | Admitting: Obstetrics

## 2018-11-29 ENCOUNTER — Other Ambulatory Visit: Payer: Self-pay

## 2018-11-29 DIAGNOSIS — O4702 False labor before 37 completed weeks of gestation, second trimester: Secondary | ICD-10-CM

## 2018-11-29 DIAGNOSIS — Z3A27 27 weeks gestation of pregnancy: Secondary | ICD-10-CM | POA: Insufficient documentation

## 2018-11-29 DIAGNOSIS — R109 Unspecified abdominal pain: Secondary | ICD-10-CM | POA: Diagnosis present

## 2018-11-29 DIAGNOSIS — O30042 Twin pregnancy, dichorionic/diamniotic, second trimester: Secondary | ICD-10-CM | POA: Diagnosis not present

## 2018-11-29 DIAGNOSIS — Z87891 Personal history of nicotine dependence: Secondary | ICD-10-CM | POA: Diagnosis not present

## 2018-11-29 DIAGNOSIS — O2342 Unspecified infection of urinary tract in pregnancy, second trimester: Secondary | ICD-10-CM | POA: Insufficient documentation

## 2018-11-29 LAB — URINALYSIS, ROUTINE W REFLEX MICROSCOPIC
Bilirubin Urine: NEGATIVE
Glucose, UA: NEGATIVE mg/dL
Ketones, ur: NEGATIVE mg/dL
Nitrite: NEGATIVE
Protein, ur: NEGATIVE mg/dL
Specific Gravity, Urine: 1.016 (ref 1.005–1.030)
WBC, UA: >50 WBC/hpf — ABNORMAL HIGH (ref 0–5)
pH: 7 (ref 5.0–8.0)

## 2018-11-29 LAB — FETAL FIBRONECTIN: Fetal Fibronectin: NEGATIVE

## 2018-11-29 MED ORDER — LACTATED RINGERS IV BOLUS
1000.0000 mL | Freq: Once | INTRAVENOUS | Status: AC
  Administered 2018-11-29: 14:00:00 1000 mL via INTRAVENOUS

## 2018-11-29 MED ORDER — CEPHALEXIN 500 MG PO CAPS: 500.0000 mg | ORAL_CAPSULE | Freq: Four times a day (QID) | ORAL | 0 refills | Status: AC

## 2018-11-29 MED ORDER — NIFEDIPINE 10 MG PO CAPS
10.0000 mg | ORAL_CAPSULE | ORAL | Status: DC | PRN
  Administered 2018-11-29 (×3): 10 mg via ORAL
  Filled 2018-11-29 (×3): qty 1

## 2018-11-29 NOTE — MAU Provider Note (Signed)
Chief Complaint:  Abdominal Pain   First Provider Initiated Contact with Patient 11/29/18 1358     HPI: Tammy Torres is a 28 y.o. G3P2002 at [redacted]w[redacted]d with di/di twins who presents to maternity admissions reporting abdominal pain. Symptoms started this morning. Feels like she's having contractions and lower abdominal cramping but can't tell how frequently they're occurring.  Denies n/v/d, dysuria, vaginal bleeding, LOF, or recent intercourse. Normal fetal movement x 2.   Location: abdomen Quality: cramping Severity: 7/10 in pain scale Duration: <1 day Timing: intermittent, can't tell how frequent Modifying factors: none Associated signs and symptoms: none  Past Medical History:  Diagnosis Date  . Abnormal Pap smear   . Asthma    states rare use; however used this am (11/29/18)  . SVD (spontaneous vaginal delivery) 06/06/2013  . UTI (urinary tract infection)    OB History  Gravida Para Term Preterm AB Living  3 2 2     2   SAB TAB Ectopic Multiple Live Births          2    # Outcome Date GA Lbr Len/2nd Weight Sex Delivery Anes PTL Lv  3 Current           2 Term 06/06/13 [redacted]w[redacted]d 04:24 / 00:11 3419 g F Vag-Spont EPI  LIV  1 Term 04/10/10 [redacted]w[redacted]d 36:00 3544 g F Vag-Spont  N LIV   Past Surgical History:  Procedure Laterality Date  . HERNIA REPAIR     Family History  Problem Relation Age of Onset  . Alcohol abuse Neg Hx   . Arthritis Neg Hx   . Asthma Neg Hx   . Birth defects Neg Hx   . Cancer Neg Hx   . COPD Neg Hx   . Depression Neg Hx   . Diabetes Neg Hx   . Drug abuse Neg Hx   . Early death Neg Hx   . Hearing loss Neg Hx   . Heart disease Neg Hx   . Hyperlipidemia Neg Hx   . Hypertension Neg Hx   . Kidney disease Neg Hx   . Learning disabilities Neg Hx   . Mental illness Neg Hx   . Mental retardation Neg Hx   . Miscarriages / Stillbirths Neg Hx   . Stroke Neg Hx   . Vision loss Neg Hx    Social History   Tobacco Use  . Smoking status: Former Smoker   Packs/day: 0.50    Types: Cigarettes  . Smokeless tobacco: Never Used  . Tobacco comment: quit 2 years ago  Substance Use Topics  . Alcohol use: No  . Drug use: Yes    Types: Marijuana   No Known Allergies No medications prior to admission.    I have reviewed patient's Past Medical Hx, Surgical Hx, Family Hx, Social Hx, medications and allergies.   ROS:  Review of Systems  Constitutional: Negative.   Gastrointestinal: Positive for abdominal pain. Negative for diarrhea, nausea and vomiting.  Genitourinary: Negative.     Physical Exam   Patient Vitals for the past 24 hrs:  BP Temp Temp src Pulse Resp SpO2 Height Weight  11/29/18 1316 109/69 98 F (36.7 C) Oral 83 16 100 % 5\' 5"  (1.651 m) 63.6 kg    Constitutional: Well-developed, well-nourished female in no acute distress.  Cardiovascular: normal rate & rhythm, no murmur Respiratory: normal effort, lung sounds clear throughout GI: Abd soft, non-tender, gravid appropriate for gestational age. Pos BS x 4 MS: Extremities nontender, no edema, normal  ROM Neurologic: Alert and oriented x 4.  GU:      Pelvic: NEFG, physiologic discharge, no blood, cervix clean.   Dilation: 1 Effacement (%): Thick Cervical Position: Posterior Station: -2 Presentation: Undeterminable Exam by:: E Lawerence, NP  Fetal Tracing: Baby A Baseline: 150 Variability: moderate Accelerations: 10x10 Decelerations: none  Baby B Baseline: 155 Variability: moderate Accelerations: 10x10 Decelerations: none  Toco: Q3-7 mins     Labs: Results for orders placed or performed during the hospital encounter of 11/29/18 (from the past 24 hour(s))  Urinalysis, Routine w reflex microscopic     Status: Abnormal   Collection Time: 11/29/18  2:12 PM  Result Value Ref Range   Color, Urine YELLOW YELLOW   APPearance CLOUDY (A) CLEAR   Specific Gravity, Urine 1.016 1.005 - 1.030   pH 7.0 5.0 - 8.0   Glucose, UA NEGATIVE NEGATIVE mg/dL   Hgb urine  dipstick MODERATE (A) NEGATIVE   Bilirubin Urine NEGATIVE NEGATIVE   Ketones, ur NEGATIVE NEGATIVE mg/dL   Protein, ur NEGATIVE NEGATIVE mg/dL   Nitrite NEGATIVE NEGATIVE   Leukocytes,Ua LARGE (A) NEGATIVE   RBC / HPF 0-5 0 - 5 RBC/hpf   WBC, UA >50 (H) 0 - 5 WBC/hpf   Bacteria, UA MANY (A) NONE SEEN   Squamous Epithelial / LPF 0-5 0 - 5   Mucus PRESENT   Fetal fibronectin     Status: None   Collection Time: 11/29/18  2:12 PM  Result Value Ref Range   Fetal Fibronectin NEGATIVE NEGATIVE    Imaging:  No results found.  MAU Course: Orders Placed This Encounter  Procedures  . Culture, OB Urine  . Urinalysis, Routine w reflex microscopic  . Fetal fibronectin  . Discharge patient   Meds ordered this encounter  Medications  . lactated ringers bolus 1,000 mL  . NIFEdipine (PROCARDIA) capsule 10 mg  . cephALEXin (KEFLEX) 500 MG capsule    Sig: Take 1 capsule (500 mg total) by mouth 4 (four) times daily for 7 days.    Dispense:  28 capsule    Refill:  0    Order Specific Question:   Supervising Provider    Answer:   Samara Snide    MDM: Contractions on monitor Q3-7 minutes. Cervix 1/thick/posterior. FFN sent.  Pt reports resolution of pain with IV fluids & 3 doses of procardia.  FFN negative & cervix unchanged.   U/a with moderate leuks & bacteria. Pt has had UTI x 2 with current pregnancy. Currently afebrile, no flank pain, no cvat on exam. Will tx for presumed infection & send urine for culture.   Pt has close follow up with appt in office tomorrow.  Assessment: 1. UTI (urinary tract infection) during pregnancy, second trimester   2. Preterm uterine contractions in second trimester, antepartum   3. [redacted] weeks gestation of pregnancy   4. Dichorionic diamniotic twin pregnancy in second trimester     Plan: Discharge home in stable condition.  Preterm Labor precautions and fetal kick counts Urine culture pending Rx keflex  Follow-up Information    Ob/Gyn,  [facility] Follow up.   Contact information: [address] [place] [address] [phone]           Allergies as of 11/29/2018   No Known Allergies     Medication List    TAKE these medications   Acetaminophen 500 MG coapsule Take 1,000 mg by mouth every 6 (six) hours as needed for mild pain  or moderate pain.   albuterol 108 (90 Base) MCG/ACT inhaler Commonly known as:  ProAir HFA Inhale 2 puffs into the lungs every 4 (four) hours as needed for wheezing or shortness of breath. 2 puffs every 4 hours as needed only  if your can't catch your breath   cephALEXin 500 MG capsule Commonly known as:  KEFLEX Take 1 capsule (500 mg total) by mouth 4 (four) times daily for 7 days.   metoCLOPramide 10 MG tablet Commonly known as:  REGLAN Take 1 tablet (10 mg total) by mouth 3 (three) times daily before meals.   ondansetron 8 MG disintegrating tablet Commonly known as:  Zofran ODT Take 1 tablet (8 mg total) by mouth every 8 (eight) hours as needed for nausea or vomiting.   Prenatal Complete 14-0.4 MG Tabs One a day   promethazine 25 MG tablet Commonly known as:  PHENERGAN Take 1 tablet (25 mg total) by mouth at bedtime as needed for nausea or vomiting.       Judeth HornLawrence, Danton Palmateer, NP 11/29/2018 6:06 PM

## 2018-11-29 NOTE — Progress Notes (Addendum)
Pt denies intercourse in last 24hrs or UTI symptoms. Pt states she has vomited 4x/24hrs.  Denies nausea at this time.

## 2018-11-29 NOTE — Discharge Instructions (Signed)
Pregnancy and Urinary Tract Infection What is a urinary tract infection?  A urinary tract infection (UTI) is an infection of any part of the urinary tract. This includes the kidneys, the tubes that connect your kidneys to your bladder (ureters), the bladder, and the tube that carries urine out of your body (urethra). These organs make, store, and get rid of urine in the body.  An upper UTI affects the ureters and kidneys (pyelonephritis), and a lower UTI affects the bladder (cystitis) and urethra (urethritis). Most urinary tract infections are caused by bacteria in your genital area, around the entrance to your urinary tract (urethra). These bacteria grow and cause irritation and inflammation of your urinary tract. Why am I more likely to get a UTI during pregnancy? You are more likely to develop a UTI during pregnancy because:  The physical and hormonal changes your body goes through can make it easier for bacteria to get into your urinary tract.  Your growing baby puts pressure on your uterus and can affect urine flow. Does a UTI place my baby at risk? An untreated UTI during pregnancy could lead to a kidney infection, which can cause health problems that could affect your baby. Possible complications of an untreated UTI include:  Having your baby before 37 weeks of pregnancy (premature).  Having a baby with a low birth weight.  Developing high blood pressure during pregnancy (preeclampsia).  Having a low hemoglobin level (anemia). What are the symptoms of a UTI? Symptoms of a UTI include:  Needing to urinate right away (urgently).  Frequent urination or passing small amounts of urine frequently.  Pain or burning with urination.  Blood in the urine.  Urine that smells bad or unusual.  Trouble urinating.  Cloudy urine.  Pain in the abdomen or lower back.  Vaginal discharge. You may also have:  Vomiting or a decreased appetite.  Confusion.  Irritability or  tiredness.  A fever.  Diarrhea. What are the treatment options for a UTI during pregnancy? Treatment for this condition may include:  Antibiotic medicines that are safe to take during pregnancy.  Other medicines to treat less common causes of UTI. How can I prevent a UTI? To prevent a UTI:  Go to the bathroom as soon as you feel the need. Do not hold urine for long periods of time.  Always wipe from front to back after a bowel movement. Use each tissue one time when you wipe.  Empty your bladder after sex.  Keep your genital area dry.  Drink 6-10 glasses of water each day.  Do not douche or use deodorant sprays. Contact a health care provider if:  Your symptoms do not improve or they get worse.  You have abnormal vaginal discharge. Get help right away if:  You have a fever.  You have nausea and vomiting.  You have back or side pain.  You feel contractions in your uterus.  You have lower belly pain.  You have a gush of fluid from your vagina.  You have blood in your urine. Summary  A urinary tract infection (UTI) is an infection of any part of the urinary tract, which includes the kidneys, ureters, bladder, and urethra.  Most urinary tract infections are caused by bacteria in your genital area, around the entrance to your urinary tract (urethra).  You are more likely to develop a UTI during pregnancy.  If you were prescribed an antibiotic, take it as told by your health care provider. Do not stop taking the  antibiotic even if you start to feel better. This information is not intended to replace advice given to you by your health care provider. Make sure you discuss any questions you have with your health care provider. Document Released: 10/25/2010 Document Revised: 08/25/2017 Document Reviewed: 05/21/2015 Elsevier Interactive Patient Education  2019 [employer]        Preterm Labor and Birth Information Pregnancy normally lasts 39-41 weeks. Preterm  labor is when labor starts early. It starts before you have been pregnant for 37 whole weeks. What are the risk factors for preterm labor? Preterm labor is more likely to occur in women who:  Have an infection while pregnant.  Have a cervix that is short.  Have gone into preterm labor before.  Have had surgery on their cervix.  Are younger than age 28.  Are older than age 28.  Are African American.  Are pregnant with two or more babies.  Take street drugs while pregnant.  Smoke while pregnant.  Do not gain enough weight while pregnant.  Got pregnant right after another pregnancy. What are the symptoms of preterm labor? Symptoms of preterm labor include:  Cramps. The cramps may feel like the cramps some women get during their period. The cramps may happen with watery poop (diarrhea).  Pain in the belly (abdomen).  Pain in the lower back.  Regular contractions or tightening. It may feel like your belly is getting tighter.  Pressure in the lower belly that seems to get stronger.  More fluid (discharge) leaking from the vagina. The fluid may be watery or bloody.  Water breaking. Why is it important to notice signs of preterm labor? Babies who are born early may not be fully developed. They have a higher chance for:  Long-term heart problems.  Long-term lung problems.  Trouble controlling body systems, like breathing.  Bleeding in the brain.  A condition called cerebral palsy.  Learning difficulties.  Death. These risks are highest for babies who are born before 34 weeks of pregnancy. How is preterm labor treated? Treatment depends on:  How long you were pregnant.  Your condition.  The health of your baby. Treatment may involve:  Having a stitch (suture) placed in your cervix. When you give birth, your cervix opens so the baby can come out. The stitch keeps the cervix from opening too soon.  Staying at the hospital.  Taking or getting medicines,  such as: ? Hormone medicines. ? Medicines to stop contractions. ? Medicines to help the babys lungs develop. ? Medicines to prevent your baby from having cerebral palsy. What should I do if I am in preterm labor? If you think you are going into labor too soon, call your doctor right away. How can I prevent preterm labor?  Do not use any tobacco products. ? Examples of these are cigarettes, chewing tobacco, and e-cigarettes. ? If you need help quitting, ask your doctor.  Do not use street drugs.  Do not use any medicines unless you ask your doctor if they are safe for you.  Talk with your doctor before taking any herbal supplements.  Make sure you gain enough weight.  Watch for infection. If you think you might have an infection, get it checked right away.  If you have gone into preterm labor before, tell your doctor. This information is not intended to replace advice given to you by your health care provider. Make sure you discuss any questions you have with your health care provider. Document Released: 09/26/2008 Document  Revised: 12/11/2015 Document Reviewed: 11/21/2015 Elsevier Interactive Patient Education  [employer].

## 2018-11-29 NOTE — MAU Note (Signed)
Pt says she's having ctx that started at 5am. States pain is 9-10/10, but does not appear to be in severe pain. No LOF or bleeding. Twin pregnancy.

## 2018-12-01 LAB — CULTURE, OB URINE: Culture: >=100000 — AB

## 2019-01-19 ENCOUNTER — Encounter (HOSPITAL_COMMUNITY): Payer: Self-pay | Admitting: *Deleted

## 2019-01-19 ENCOUNTER — Other Ambulatory Visit: Payer: Self-pay

## 2019-01-19 ENCOUNTER — Inpatient Hospital Stay (HOSPITAL_COMMUNITY)
Admission: AC | Admit: 2019-01-19 | Discharge: 2019-01-20 | Disposition: A | Discharge status: Home / Self Care | Payer: Medicaid Other | Source: Ambulatory Visit | Attending: Obstetrics and Gynecology | Admitting: Obstetrics and Gynecology

## 2019-01-19 DIAGNOSIS — Z3A35 35 weeks gestation of pregnancy: Secondary | ICD-10-CM | POA: Diagnosis not present

## 2019-01-19 DIAGNOSIS — O2313 Infections of bladder in pregnancy, third trimester: Secondary | ICD-10-CM | POA: Diagnosis not present

## 2019-01-19 DIAGNOSIS — O26893 Other specified pregnancy related conditions, third trimester: Secondary | ICD-10-CM

## 2019-01-19 DIAGNOSIS — Z87891 Personal history of nicotine dependence: Secondary | ICD-10-CM | POA: Insufficient documentation

## 2019-01-19 DIAGNOSIS — Z3A34 34 weeks gestation of pregnancy: Secondary | ICD-10-CM | POA: Diagnosis not present

## 2019-01-19 DIAGNOSIS — N3000 Acute cystitis without hematuria: Secondary | ICD-10-CM

## 2019-01-19 LAB — URINALYSIS, ROUTINE W REFLEX MICROSCOPIC
Bilirubin Urine: NEGATIVE
Glucose, UA: NEGATIVE mg/dL
Ketones, ur: NEGATIVE mg/dL
Nitrite: POSITIVE — AB
Protein, ur: NEGATIVE mg/dL
Specific Gravity, Urine: 1.009 (ref 1.005–1.030)
pH: 6 (ref 5.0–8.0)

## 2019-01-19 LAB — POCT FERN TEST: POCT Fern Test: NEGATIVE

## 2019-01-19 LAB — WET PREP, GENITAL
Sperm: NONE SEEN
Trich, Wet Prep: NONE SEEN
Yeast Wet Prep HPF POC: NONE SEEN

## 2019-01-19 LAB — AMNISURE RUPTURE OF MEMBRANE (ROM) NOT AT ARMC: Amnisure ROM: NEGATIVE

## 2019-01-19 MED ORDER — CYCLOBENZAPRINE HCL 10 MG PO TABS
10.0000 mg | ORAL_TABLET | Freq: Once | ORAL | Status: AC
  Administered 2019-01-19: 10 mg via ORAL
  Filled 2019-01-19: qty 1

## 2019-01-19 MED ORDER — CEPHALEXIN 500 MG PO CAPS
500.0000 mg | ORAL_CAPSULE | Freq: Four times a day (QID) | ORAL | Status: DC
  Filled 2019-01-19 (×2): qty 1

## 2019-01-19 MED ORDER — CEPHALEXIN 500 MG PO CAPS: 500.0000 mg | ORAL_CAPSULE | Freq: Four times a day (QID) | ORAL | 0 refills | Status: DC

## 2019-01-19 MED ORDER — LACTATED RINGERS IV BOLUS
1000.0000 mL | Freq: Once | INTRAVENOUS | Status: AC
  Administered 2019-01-19: 1000 mL via INTRAVENOUS

## 2019-01-19 MED ORDER — NIFEDIPINE 10 MG PO CAPS
10.0000 mg | ORAL_CAPSULE | ORAL | Status: AC | PRN
  Administered 2019-01-19 (×3): 10 mg via ORAL
  Filled 2019-01-19 (×3): qty 1

## 2019-01-19 NOTE — MAU Note (Signed)
Pt reports she is having ctx q 5-6 min started earlier today. Also stated ewhen she woke up today her panties where very wet ans she is continuing to have a "mucusy discharge" Good fetal movement reported. "

## 2019-01-19 NOTE — Discharge Instructions (Signed)
Preterm Labor and Birth Information °Pregnancy normally lasts 39-41 weeks. Preterm labor is when labor starts early. It starts before you have been pregnant for 37 whole weeks. °What are the risk factors for preterm labor? °Preterm labor is more likely to occur in women who: °· Have an infection while pregnant. °· Have a cervix that is short. °· Have gone into preterm labor before. °· Have had surgery on their cervix. °· Are younger than age 28. °· Are older than age 35. °· Are African American. °· Are pregnant with two or more babies. °· Take street drugs while pregnant. °· Smoke while pregnant. °· Do not gain enough weight while pregnant. °· Got pregnant right after another pregnancy. °What are the symptoms of preterm labor? °Symptoms of preterm labor include: °· Cramps. The cramps may feel like the cramps some women get during their period. The cramps may happen with watery poop (diarrhea). °· Pain in the belly (abdomen). °· Pain in the lower back. °· Regular contractions or tightening. It may feel like your belly is getting tighter. °· Pressure in the lower belly that seems to get stronger. °· More fluid (discharge) leaking from the vagina. The fluid may be watery or bloody. °· Water breaking. °Why is it important to notice signs of preterm labor? °Babies who are born early may not be fully developed. They have a higher chance for: °· Long-term heart problems. °· Long-term lung problems. °· Trouble controlling body systems, like breathing. °· Bleeding in the brain. °· A condition called cerebral palsy. °· Learning difficulties. °· Death. °These risks are highest for babies who are born before 34 weeks of pregnancy. °How is preterm labor treated? °Treatment depends on: °· How long you were pregnant. °· Your condition. °· The health of your baby. °Treatment may involve: °· Having a stitch (suture) placed in your cervix. When you give birth, your cervix opens so the baby can come out. The stitch keeps the cervix  from opening too soon. °· Staying at the hospital. °· Taking or getting medicines, such as: °? Hormone medicines. °? Medicines to stop contractions. °? Medicines to help the baby’s lungs develop. °? Medicines to prevent your baby from having cerebral palsy. °What should I do if I am in preterm labor? °If you think you are going into labor too soon, call your doctor right away. °How can I prevent preterm labor? °· Do not use any tobacco products. °? Examples of these are cigarettes, chewing tobacco, and e-cigarettes. °? If you need help quitting, ask your doctor. °· Do not use street drugs. °· Do not use any medicines unless you ask your doctor if they are safe for you. °· Talk with your doctor before taking any herbal supplements. °· Make sure you gain enough weight. °· Watch for infection. If you think you might have an infection, get it checked right away. °· If you have gone into preterm labor before, tell your doctor. °This information is not intended to replace advice given to you by your health care provider. Make sure you discuss any questions you have with your health care provider. °Document Released: 09/26/2008 Document Revised: 10/22/2018 Document Reviewed: 11/21/2015 °Elsevier Patient Education © 2020 Elsevier Inc. ° °

## 2019-01-19 NOTE — MAU Provider Note (Addendum)
History     CSN: [id number]  Arrival date and time: 01/19/19 1753   First Provider Initiated Contact with Patient 01/19/19 1858      Chief Complaint  Patient presents with  . Contractions  . Vaginal Discharge   Ms. Tammy Torres is a 28 y.o. G3P2002 at 258w4d who presents to MAU for ctx. Pt also reports this morning she woke up and her "panties were soaking wet" with a clear-mucus fluid. Pt reports she has experienced some additional discharge throughout the day that appears as a clear mucus like earlier, but a lesser volume. Provider is able to palpate ctx at bedside.  CTX Onset: last month, but this morning around 0600, pt reports she was awoken from sleep with her ctx and they are becoming frequent and stronger Location: abdomen Duration: 1 month Character: painful, "feel like they are coming back to back" Aggravating/Associated: none/none Relieving: none Treatment: walking, lying down, warm bath, breathing/relaxing exercises - ctx continued to get worse Severity: 9/10  Pt denies VB, decreased FM, vaginal odor/itching. Pt denies N/V, abdominal pain, constipation, diarrhea, or urinary problems. Pt denies fever, chills, fatigue, sweating or changes in appetite. Pt denies SOB or chest pain. Pt denies dizziness, HA, light-headedness, weakness.  Problems this pregnancy include: di-di twins, [facility] "sacs are [facility]". Allergies? NKDA Current medications/supplements? PNVs Prenatal care provider? [facility], next appt 01/26/2019   OB History    Gravida  3   Para  2   Term  2   Preterm      AB      Living  2     SAB      TAB      Ectopic      Multiple      Live Births  2           Past Medical History:  Diagnosis Date  . Abnormal Pap smear   . Asthma    states rare use; however used this am (11/29/18)  . SVD (spontaneous vaginal delivery) 06/06/2013  . UTI (urinary tract infection)     Past Surgical History:   Procedure Laterality Date  . HERNIA REPAIR      Family History  Problem Relation Age of Onset  . Alcohol abuse Neg Hx   . Arthritis Neg Hx   . Asthma Neg Hx   . Birth defects Neg Hx   . Cancer Neg Hx   . COPD Neg Hx   . Depression Neg Hx   . Diabetes Neg Hx   . Drug abuse Neg Hx   . Early death Neg Hx   . Hearing loss Neg Hx   . Heart disease Neg Hx   . Hyperlipidemia Neg Hx   . Hypertension Neg Hx   . Kidney disease Neg Hx   . Learning disabilities Neg Hx   . Mental illness Neg Hx   . Mental retardation Neg Hx   . Miscarriages / Stillbirths Neg Hx   . Stroke Neg Hx   . Vision loss Neg Hx     Social History   Tobacco Use  . Smoking status: Former Smoker    Packs/day: 0.50    Types: Cigarettes  . Smokeless tobacco: Never Used  . Tobacco comment: quit 2 years ago  Substance Use Topics  . Alcohol use: No  . Drug use: Yes    Types: Marijuana    Allergies: No Known Allergies  Medications Prior to Admission  Medication Sig Dispense  Refill Last Dose  . Acetaminophen 500 MG coapsule Take 1,000 mg by mouth every 6 (six) hours as needed for mild pain or moderate pain.    Past Month at Unknown time  . albuterol (PROAIR HFA) 108 (90 BASE) MCG/ACT inhaler Inhale 2 puffs into the lungs every 4 (four) hours as needed for wheezing or shortness of breath. 2 puffs every 4 hours as needed only  if your can't catch your breath 1 Inhaler 2 Past Month at Unknown time  . metoCLOPramide (REGLAN) 10 MG tablet Take 1 tablet (10 mg total) by mouth 3 (three) times daily before meals. 30 tablet 0 Past Month at Unknown time  . ondansetron (ZOFRAN ODT) 8 MG disintegrating tablet Take 1 tablet (8 mg total) by mouth every 8 (eight) hours as needed for nausea or vomiting. 20 tablet 0 Past Month at Unknown time  . Prenatal Vit-Fe Fumarate-FA (PRENATAL COMPLETE) 14-0.4 MG TABS One a day 60 each 2 01/19/2019 at Unknown time  . promethazine (PHENERGAN) 25 MG tablet Take 1 tablet (25 mg total) by mouth  at bedtime as needed for nausea or vomiting. 30 tablet 0 Past Month at Unknown time    Review of Systems  Constitutional: Negative for chills, diaphoresis, fatigue and fever.  Respiratory: Negative for shortness of breath.   Cardiovascular: Negative for chest pain.  Gastrointestinal: Positive for abdominal pain. Negative for constipation, diarrhea, nausea and vomiting.  Genitourinary: Positive for vaginal discharge. Negative for dysuria, flank pain, frequency, pelvic pain, urgency and vaginal bleeding.  Neurological: Negative for dizziness, weakness, light-headedness and headaches.   Physical Exam   Blood pressure 122/77, pulse 88, temperature 98.4 F (36.9 C), resp. rate 18, height 5\' 5"  (1.651 m), weight 66.7 kg, last menstrual period 06/21/2018, unknown if currently breastfeeding.  Patient Vitals for the past 24 hrs:  BP Temp Pulse Resp Height Weight  01/19/19 1951 122/77 - - - - -  01/19/19 1822 113/77 98.4 F (36.9 C) 88 18 5\' 5"  (1.651 m) 66.7 kg   Physical Exam  Constitutional: She is oriented to person, place, and time. She appears well-developed and well-nourished. No distress.  HENT:  Head: Normocephalic and atraumatic.  Respiratory: Effort normal.  GI: Soft. She exhibits no distension and no mass. There is no abdominal tenderness. There is no rebound and no guarding.  Neurological: She is alert and oriented to person, place, and time.  Skin: Skin is warm and dry. She is not diaphoretic.  Psychiatric: She has a normal mood and affect. Her behavior is normal. Judgment and thought content normal.   Results for orders placed or performed during the hospital encounter of 01/19/19 (from the past 24 hour(s))  Urinalysis, Routine w reflex microscopic     Status: Abnormal   Collection Time: 01/19/19  7:16 PM  Result Value Ref Range   Color, Urine YELLOW YELLOW   APPearance HAZY (A) CLEAR   Specific Gravity, Urine 1.009 1.005 - 1.030   pH 6.0 5.0 - 8.0   Glucose, UA NEGATIVE  NEGATIVE mg/dL   Hgb urine dipstick MODERATE (A) NEGATIVE   Bilirubin Urine NEGATIVE NEGATIVE   Ketones, ur NEGATIVE NEGATIVE mg/dL   Protein, ur NEGATIVE NEGATIVE mg/dL   Nitrite POSITIVE (A) NEGATIVE   Leukocytes,Ua LARGE (A) NEGATIVE   RBC / HPF 0-5 0 - 5 RBC/hpf   WBC, UA 21-50 0 - 5 WBC/hpf   Bacteria, UA MANY (A) NONE SEEN   Squamous Epithelial / LPF 6-10 0 - 5   Mucus  PRESENT   Wet prep, genital     Status: Abnormal   Collection Time: 01/19/19  7:19 PM   Specimen: Cervix  Result Value Ref Range   Yeast Wet Prep HPF POC NONE SEEN NONE SEEN   Trich, Wet Prep NONE SEEN NONE SEEN   Clue Cells Wet Prep HPF POC PRESENT (A) NONE SEEN   WBC, Wet Prep HPF POC MANY (A) NONE SEEN   Sperm NONE SEEN    No results found.  MAU Course  Procedures  MDM -r/o ROM -no pooling on SSE, copious white, thin, frothy discharge noted -Fern: negative -Amnisure ordered -WetPrep: +ClueCells, many WBCs -GC/CT collected  -r/o PTL -UA: hazy/mod hgb/+nitrites/lg leuks/many bacteria, sending urine for culture -Dilation: Fingertip Effacement (%): Thick Cervical Position: Posterior Exam by:: N Nugent NP -1L LR given -Procardia ordered 10mg  x3doses q79min  -EFM, Twin A: unable to assess, no continuous 81min tracing  -EFM, Twin B: reactive with few variables       -baseline: 140       -variability: moderate       -accels: present, 15x15       -decels: few variable       -TOCO: irregular ctx q4-52min  -care transferred to Maye Hides, CNM @2005  Nugent, Gerrie Nordmann, NP  8:12 PM 01/19/2019  Orders Placed This Encounter  Procedures  . Wet prep, genital    Standing Status:   Standing    Number of Occurrences:   1  . Culture, OB Urine    Standing Status:   Standing    Number of Occurrences:   1  . Urinalysis, Routine w reflex microscopic    Standing Status:   Standing    Number of Occurrences:   1  . Amnisure rupture of membrane (rom)not at [facility]    Standing Status:   Standing     Number of Occurrences:   1  . POCT fern test    Standing Status:   Standing    Number of Occurrences:   1  . Insert peripheral IV    Standing Status:   Standing    Number of Occurrences:   1   Meds ordered this encounter  Medications  . lactated ringers bolus 1,000 mL  . NIFEdipine (PROCARDIA) capsule 10 mg   Assessment and Plan   Assumed care of patient at 2000. Patient still reporting pain 8/10 with contractions but reports that the fetal movements with the contractions is part of her pain. She is requesting a snack.  2100: Patient has now received 3 doses of procardia,  LR still infusing.  Amnisure negative -patient still complaining of low pelvic pain; recheck of cervix shows that she is still FT and posterior. No cervical change. Flexeril did not help with contractions.   EFM; Baby A: 150, mod var, present acel, neg decels; patient feels strong fetal movements.  EFM: Baby B: 150, mod var, present acel, neg decels, patient feels strong fetal movements.  Toco: irregular contractions; spaced out and lessened slightly with procardia and fluids; however cervix is unchanged after two hours.   Plan:  1. Preterm labor in third trimester without delivery   2. Acute cystitis without hematuria   -Discharge home with return precautions -Keep appt with Ob-Gyn next week.  -Start Flagyl, Keflex antibiotics and send urine for culture -GC chlamydia pending -All questions answered  Maye Hides

## 2019-01-20 LAB — POCT FERN TEST: POCT Fern Test: POSITIVE

## 2019-01-20 MED ORDER — METRONIDAZOLE 500 MG PO TABS: 500.0000 mg | ORAL_TABLET | Freq: Two times a day (BID) | ORAL | 0 refills | Status: DC

## 2019-01-21 LAB — CULTURE, OB URINE: Culture: >=100000 — AB

## 2019-01-24 LAB — GC/CHLAMYDIA PROBE AMP (~~LOC~~) NOT AT ARMC
Chlamydia: NEGATIVE
Neisseria Gonorrhea: NEGATIVE

## 2019-01-26 ENCOUNTER — Inpatient Hospital Stay (HOSPITAL_COMMUNITY)
Admission: AD | Admit: 2019-01-26 | Discharge: 2019-01-28 | DRG: 807 | Disposition: A | Discharge status: Home / Self Care | Payer: Medicaid Other | Attending: Obstetrics and Gynecology | Admitting: Obstetrics and Gynecology

## 2019-01-26 ENCOUNTER — Inpatient Hospital Stay (HOSPITAL_COMMUNITY): Payer: Medicaid Other | Admitting: Anesthesiology

## 2019-01-26 ENCOUNTER — Other Ambulatory Visit: Payer: Self-pay

## 2019-01-26 ENCOUNTER — Encounter (HOSPITAL_COMMUNITY)
Admission: AD | Disposition: A | Discharge status: Home / Self Care | Payer: Self-pay | Source: Home / Self Care | Attending: Obstetrics and Gynecology

## 2019-01-26 ENCOUNTER — Encounter (HOSPITAL_COMMUNITY): Payer: Self-pay

## 2019-01-26 DIAGNOSIS — O4693 Antepartum hemorrhage, unspecified, third trimester: Secondary | ICD-10-CM

## 2019-01-26 DIAGNOSIS — Z1159 Encounter for screening for other viral diseases: Secondary | ICD-10-CM

## 2019-01-26 DIAGNOSIS — Z87891 Personal history of nicotine dependence: Secondary | ICD-10-CM | POA: Diagnosis not present

## 2019-01-26 DIAGNOSIS — O99324 Drug use complicating childbirth: Principal | ICD-10-CM | POA: Diagnosis present

## 2019-01-26 DIAGNOSIS — O9952 Diseases of the respiratory system complicating childbirth: Secondary | ICD-10-CM | POA: Diagnosis present

## 2019-01-26 DIAGNOSIS — J45909 Unspecified asthma, uncomplicated: Secondary | ICD-10-CM | POA: Diagnosis present

## 2019-01-26 DIAGNOSIS — F1290 Cannabis use, unspecified, uncomplicated: Secondary | ICD-10-CM | POA: Diagnosis present

## 2019-01-26 DIAGNOSIS — Z3A35 35 weeks gestation of pregnancy: Secondary | ICD-10-CM | POA: Diagnosis not present

## 2019-01-26 LAB — URINALYSIS, ROUTINE W REFLEX MICROSCOPIC
Bilirubin Urine: NEGATIVE
Glucose, UA: NEGATIVE mg/dL
Ketones, ur: NEGATIVE mg/dL
Nitrite: NEGATIVE
Protein, ur: NEGATIVE mg/dL
Specific Gravity, Urine: 1.005 (ref 1.005–1.030)
pH: 6 (ref 5.0–8.0)

## 2019-01-26 LAB — TYPE AND SCREEN
ABO/RH(D): B POS
Antibody Screen: NEGATIVE

## 2019-01-26 LAB — CBC
HCT: 33.9 % — ABNORMAL LOW (ref 36.0–46.0)
Hemoglobin: 10.3 g/dL — ABNORMAL LOW (ref 12.0–15.0)
MCH: 26.4 pg (ref 26.0–34.0)
MCHC: 30.4 g/dL (ref 30.0–36.0)
MCV: 86.9 fL (ref 80.0–100.0)
Platelets: 171 10*3/uL (ref 150–400)
RBC: 3.9 MIL/uL (ref 3.87–5.11)
RDW: 13.4 % (ref 11.5–15.5)
WBC: 12.2 10*3/uL — ABNORMAL HIGH (ref 4.0–10.5)
nRBC: 0 % (ref 0.0–0.2)

## 2019-01-26 LAB — SARS CORONAVIRUS 2 BY RT PCR (HOSPITAL ORDER, PERFORMED IN ~~LOC~~ HOSPITAL LAB): SARS Coronavirus 2: NEGATIVE

## 2019-01-26 LAB — GROUP B STREP BY PCR: Group B strep by PCR: NEGATIVE

## 2019-01-26 LAB — ABO/RH: ABO/RH(D): B POS

## 2019-01-26 LAB — RPR: RPR Ser Ql: NONREACTIVE

## 2019-01-26 SURGERY — Surgical Case
Anesthesia: Epidural | Site: Vagina | Wound class: Clean

## 2019-01-26 MED ORDER — SODIUM CHLORIDE (PF) 0.9 % IJ SOLN
INTRAMUSCULAR | Status: DC | PRN
  Administered 2019-01-26: 12 mL/h via EPIDURAL

## 2019-01-26 MED ORDER — LIDOCAINE HCL (PF) 1 % IJ SOLN
INTRAMUSCULAR | Status: DC | PRN
  Administered 2019-01-26: 5 mL via EPIDURAL

## 2019-01-26 MED ORDER — PHENYLEPHRINE 40 MCG/ML (10ML) SYRINGE FOR IV PUSH (FOR BLOOD PRESSURE SUPPORT): 80.0000 ug | PREFILLED_SYRINGE | INTRAVENOUS | Status: DC | PRN

## 2019-01-26 MED ORDER — SENNOSIDES-DOCUSATE SODIUM 8.6-50 MG PO TABS
2.0000 | ORAL_TABLET | ORAL | Status: DC
  Administered 2019-01-26: 2 via ORAL
  Filled 2019-01-26 (×2): qty 2

## 2019-01-26 MED ORDER — OXYCODONE-ACETAMINOPHEN 5-325 MG PO TABS: 2.0000 | ORAL_TABLET | ORAL | Status: DC | PRN

## 2019-01-26 MED ORDER — EPHEDRINE 5 MG/ML INJ: 10.0000 mg | INTRAVENOUS | Status: DC | PRN

## 2019-01-26 MED ORDER — OXYCODONE-ACETAMINOPHEN 5-325 MG PO TABS: 1.0000 | ORAL_TABLET | ORAL | Status: DC | PRN

## 2019-01-26 MED ORDER — OXYTOCIN BOLUS FROM INFUSION
500.0000 mL | Freq: Once | INTRAVENOUS | Status: DC
  Administered 2019-01-26: 500 mL via INTRAVENOUS

## 2019-01-26 MED ORDER — FENTANYL-BUPIVACAINE-NACL 0.5-0.125-0.9 MG/250ML-% EP SOLN
12.0000 mL/h | EPIDURAL | Status: DC | PRN
  Filled 2019-01-26: qty 250

## 2019-01-26 MED ORDER — LACTATED RINGERS IV SOLN: 500.0000 mL | INTRAVENOUS | Status: DC | PRN

## 2019-01-26 MED ORDER — SIMETHICONE 80 MG PO CHEW: 80.0000 mg | CHEWABLE_TABLET | ORAL | Status: DC | PRN

## 2019-01-26 MED ORDER — ZOLPIDEM TARTRATE 5 MG PO TABS: 5.0000 mg | ORAL_TABLET | Freq: Every evening | ORAL | Status: DC | PRN

## 2019-01-26 MED ORDER — PHENYLEPHRINE 40 MCG/ML (10ML) SYRINGE FOR IV PUSH (FOR BLOOD PRESSURE SUPPORT)
80.0000 ug | PREFILLED_SYRINGE | INTRAVENOUS | Status: DC | PRN
  Filled 2019-01-26: qty 10

## 2019-01-26 MED ORDER — METHYLERGONOVINE MALEATE 0.2 MG/ML IJ SOLN: 0.2000 mg | INTRAMUSCULAR | Status: DC | PRN

## 2019-01-26 MED ORDER — OXYTOCIN 40 UNITS IN NORMAL SALINE INFUSION - SIMPLE MED
INTRAVENOUS | Status: AC
  Filled 2019-01-26: qty 1000

## 2019-01-26 MED ORDER — PHENYLEPHRINE 40 MCG/ML (10ML) SYRINGE FOR IV PUSH (FOR BLOOD PRESSURE SUPPORT)
80.0000 ug | PREFILLED_SYRINGE | INTRAVENOUS | Status: DC | PRN
  Administered 2019-01-26 (×2): 80 ug via INTRAVENOUS

## 2019-01-26 MED ORDER — IBUPROFEN 600 MG PO TABS
600.0000 mg | ORAL_TABLET | Freq: Four times a day (QID) | ORAL | Status: DC
  Administered 2019-01-26 – 2019-01-28 (×7): 600 mg via ORAL
  Filled 2019-01-26 (×7): qty 1

## 2019-01-26 MED ORDER — COCONUT OIL OIL: 1.0000 "application " | TOPICAL_OIL | Status: DC | PRN

## 2019-01-26 MED ORDER — BETAMETHASONE SOD PHOS & ACET 6 (3-3) MG/ML IJ SUSP
12.0000 mg | INTRAMUSCULAR | Status: DC
  Administered 2019-01-26: 12 mg via INTRAMUSCULAR
  Filled 2019-01-26 (×2): qty 2

## 2019-01-26 MED ORDER — TETANUS-DIPHTH-ACELL PERTUSSIS 5-2.5-18.5 LF-MCG/0.5 IM SUSP: 0.5000 mL | Freq: Once | INTRAMUSCULAR | Status: DC

## 2019-01-26 MED ORDER — LIDOCAINE HCL (PF) 1 % IJ SOLN: 30.0000 mL | INTRAMUSCULAR | Status: DC | PRN

## 2019-01-26 MED ORDER — ACETAMINOPHEN 325 MG PO TABS
650.0000 mg | ORAL_TABLET | ORAL | Status: DC | PRN
  Administered 2019-01-27 – 2019-01-28 (×2): 650 mg via ORAL
  Filled 2019-01-26 (×2): qty 2

## 2019-01-26 MED ORDER — PRENATAL MULTIVITAMIN CH
1.0000 | ORAL_TABLET | Freq: Every day | ORAL | Status: DC
  Administered 2019-01-27 – 2019-01-28 (×2): 1 via ORAL
  Filled 2019-01-26 (×2): qty 1

## 2019-01-26 MED ORDER — BENZOCAINE-MENTHOL 20-0.5 % EX AERO: 1.0000 "application " | INHALATION_SPRAY | CUTANEOUS | Status: DC | PRN

## 2019-01-26 MED ORDER — DIBUCAINE (PERIANAL) 1 % EX OINT: 1.0000 "application " | TOPICAL_OINTMENT | CUTANEOUS | Status: DC | PRN

## 2019-01-26 MED ORDER — DIPHENHYDRAMINE HCL 50 MG/ML IJ SOLN
12.5000 mg | INTRAMUSCULAR | Status: AC | PRN
  Administered 2019-01-26 (×3): 12.5 mg via INTRAVENOUS
  Filled 2019-01-26: qty 1

## 2019-01-26 MED ORDER — DIPHENHYDRAMINE HCL 25 MG PO CAPS: 25.0000 mg | ORAL_CAPSULE | Freq: Four times a day (QID) | ORAL | Status: DC | PRN

## 2019-01-26 MED ORDER — METHYLERGONOVINE MALEATE 0.2 MG PO TABS: 0.2000 mg | ORAL_TABLET | ORAL | Status: DC | PRN

## 2019-01-26 MED ORDER — ONDANSETRON HCL 4 MG/2ML IJ SOLN
4.0000 mg | Freq: Four times a day (QID) | INTRAMUSCULAR | Status: DC | PRN
  Administered 2019-01-26: 4 mg via INTRAVENOUS
  Filled 2019-01-26: qty 2

## 2019-01-26 MED ORDER — ONDANSETRON HCL 4 MG PO TABS: 4.0000 mg | ORAL_TABLET | ORAL | Status: DC | PRN

## 2019-01-26 MED ORDER — SOD CITRATE-CITRIC ACID 500-334 MG/5ML PO SOLN: 30.0000 mL | ORAL | Status: DC | PRN

## 2019-01-26 MED ORDER — NIFEDIPINE 10 MG PO CAPS
20.0000 mg | ORAL_CAPSULE | Freq: Once | ORAL | Status: AC
  Administered 2019-01-26: 20 mg via ORAL
  Filled 2019-01-26: qty 2

## 2019-01-26 MED ORDER — LACTATED RINGERS IV SOLN: 500.0000 mL | Freq: Once | INTRAVENOUS | Status: DC

## 2019-01-26 MED ORDER — OXYTOCIN 40 UNITS IN NORMAL SALINE INFUSION - SIMPLE MED
2.5000 [IU]/h | INTRAVENOUS | Status: DC
  Administered 2019-01-26: 2.5 [IU]/h via INTRAVENOUS

## 2019-01-26 MED ORDER — LACTATED RINGERS IV SOLN
INTRAVENOUS | Status: DC
  Administered 2019-01-26 (×3): via INTRAVENOUS

## 2019-01-26 MED ORDER — FENTANYL-BUPIVACAINE-NACL 0.5-0.125-0.9 MG/250ML-% EP SOLN: 12.0000 mL/h | EPIDURAL | Status: DC | PRN

## 2019-01-26 MED ORDER — ONDANSETRON HCL 4 MG/2ML IJ SOLN: 4.0000 mg | INTRAMUSCULAR | Status: DC | PRN

## 2019-01-26 MED ORDER — ACETAMINOPHEN 325 MG PO TABS: 650.0000 mg | ORAL_TABLET | ORAL | Status: DC | PRN

## 2019-01-26 MED ORDER — WITCH HAZEL-GLYCERIN EX PADS: 1.0000 "application " | MEDICATED_PAD | CUTANEOUS | Status: DC | PRN

## 2019-01-26 SURGICAL SUPPLY — 34 items
ADH SKN CLS APL DERMABOND .7 (GAUZE/BANDAGES/DRESSINGS)
CHLORAPREP W/TINT 26ML (MISCELLANEOUS) ×4 IMPLANT
CLAMP CORD UMBIL (MISCELLANEOUS) IMPLANT
CLOTH BEACON ORANGE TIMEOUT ST (SAFETY) ×4 IMPLANT
DERMABOND ADVANCED (GAUZE/BANDAGES/DRESSINGS)
DERMABOND ADVANCED .7 DNX12 (GAUZE/BANDAGES/DRESSINGS) IMPLANT
DRSG OPSITE POSTOP 4X10 (GAUZE/BANDAGES/DRESSINGS) ×4 IMPLANT
ELECT REM PT RETURN 9FT ADLT (ELECTROSURGICAL) ×4
ELECTRODE REM PT RTRN 9FT ADLT (ELECTROSURGICAL) ×2 IMPLANT
EXTRACTOR VACUUM M CUP 4 TUBE (SUCTIONS) IMPLANT
EXTRACTOR VACUUM M CUP 4' TUBE (SUCTIONS)
GLOVE BIO SURGEON STRL SZ7 (GLOVE) ×4 IMPLANT
GLOVE BIOGEL PI IND STRL 7.0 (GLOVE) ×2 IMPLANT
GLOVE BIOGEL PI INDICATOR 7.0 (GLOVE) ×2
GOWN STRL REUS W/TWL LRG LVL3 (GOWN DISPOSABLE) ×8 IMPLANT
KIT ABG SYR 3ML LUER SLIP (SYRINGE) IMPLANT
NDL HYPO 25X5/8 SAFETYGLIDE (NEEDLE) IMPLANT
NEEDLE HYPO 22GX1.5 SAFETY (NEEDLE) IMPLANT
NEEDLE HYPO 25X5/8 SAFETYGLIDE (NEEDLE) IMPLANT
NS IRRIG 1000ML POUR BTL (IV SOLUTION) ×4 IMPLANT
PACK C SECTION WH (CUSTOM PROCEDURE TRAY) ×4 IMPLANT
PAD OB MATERNITY 4.3X12.25 (PERSONAL CARE ITEMS) ×4 IMPLANT
PENCIL SMOKE EVAC W/HOLSTER (ELECTROSURGICAL) ×4 IMPLANT
RTRCTR C-SECT PINK 25CM LRG (MISCELLANEOUS) ×4 IMPLANT
SUT CHROMIC 1 CTX 36 (SUTURE) ×8 IMPLANT
SUT CHROMIC 2 0 CT 1 (SUTURE) ×4 IMPLANT
SUT PDS AB 0 CTX 60 (SUTURE) ×4 IMPLANT
SUT VIC AB 2-0 CT1 27 (SUTURE) ×4
SUT VIC AB 2-0 CT1 TAPERPNT 27 (SUTURE) ×2 IMPLANT
SUT VIC AB 4-0 KS 27 (SUTURE) IMPLANT
SYR 30ML LL (SYRINGE) IMPLANT
TOWEL OR 17X24 6PK STRL BLUE (TOWEL DISPOSABLE) ×4 IMPLANT
TRAY FOLEY W/BAG SLVR 14FR LF (SET/KITS/TRAYS/PACK) ×4 IMPLANT
WATER STERILE IRR 1000ML POUR (IV SOLUTION) ×4 IMPLANT

## 2019-01-26 NOTE — MAU Provider Note (Signed)
Chief Complaint:  Rupture of Membranes, Contractions, and Vaginal Bleeding   Seen by provider at 0430hrs  HPI: Tammy Torres is a 28 y.o. [id number] at 6435w4dwho presents to maternity admissions reporting leaking of blood tinged fluid at 3am.  Has bled enough that it required a pad.  Feels a lot of pressure with contractions, which are painful.. She reports good fetal movement, denies vaginal itching/burning, urinary symptoms, h/a, dizziness, n/v, diarrhea, constipation or fever/chills.  She denies headache, visual changes or RUQ abdominal pain.  RN Note: Pt reports to MAU c/o SROM @0300 . Pt reports she also had a lot of bleeding pt reports wearing a panty liner. +FM. Lots of pressure in her vagina occasional ctx.   Past Medical History: Past Medical History:  Diagnosis Date  . Abnormal Pap smear   . Asthma    states rare use; however used this am (11/29/18)  . SVD (spontaneous vaginal delivery) 06/06/2013  . UTI (urinary tract infection)     Past obstetric history: OB History  Gravida Para Term Preterm AB Living  3 2 2     2   SAB TAB Ectopic Multiple Live Births          2    # Outcome Date GA Lbr Len/2nd Weight Sex Delivery Anes PTL Lv  3 Current           2 Term 06/06/13 2851w6d 04:24 / 00:11 3419 g F Vag-Spont EPI  LIV  1 Term 04/10/10 530w0d 36:00 3544 g F Vag-Spont  N LIV    Past Surgical History: Past Surgical History:  Procedure Laterality Date  . HERNIA REPAIR      Family History: Family History  Problem Relation Age of Onset  . Alcohol abuse Neg Hx   . Arthritis Neg Hx   . Asthma Neg Hx   . Birth defects Neg Hx   . Cancer Neg Hx   . COPD Neg Hx   . Depression Neg Hx   . Diabetes Neg Hx   . Drug abuse Neg Hx   . Early death Neg Hx   . Hearing loss Neg Hx   . Heart disease Neg Hx   . Hyperlipidemia Neg Hx   . Hypertension Neg Hx   . Kidney disease Neg Hx   . Learning disabilities Neg Hx   . Mental illness Neg Hx   . Mental retardation Neg Hx   .  Miscarriages / Stillbirths Neg Hx   . Stroke Neg Hx   . Vision loss Neg Hx     Social History: Social History   Tobacco Use  . Smoking status: Former Smoker    Packs/day: 0.50    Types: Cigarettes  . Smokeless tobacco: Never Used  . Tobacco comment: quit 2 years ago  Substance Use Topics  . Alcohol use: No  . Drug use: Yes    Types: Marijuana    Allergies: No Known Allergies  Meds:  Medications Prior to Admission  Medication Sig Dispense Refill Last Dose  . cephALEXin (KEFLEX) 500 MG capsule Take 1 capsule (500 mg total) by mouth 4 (four) times daily. 40 capsule 0 01/25/2019 at Unknown time  . metroNIDAZOLE (FLAGYL) 500 MG tablet Take 1 tablet (500 mg total) by mouth 2 (two) times daily. 14 tablet 0 01/25/2019 at Unknown time  . Prenatal Vit-Fe Fumarate-FA (PRENATAL COMPLETE) 14-0.4 MG TABS One a day 60 each 2 01/25/2019 at Unknown time  . Acetaminophen 500 MG coapsule Take 1,000 mg by mouth every 6 (  six) hours as needed for mild pain or moderate pain.      Marland Kitchen albuterol (PROAIR HFA) 108 (90 BASE) MCG/ACT inhaler Inhale 2 puffs into the lungs every 4 (four) hours as needed for wheezing or shortness of breath. 2 puffs every 4 hours as needed only  if your can't catch your breath 1 Inhaler 2   . metoCLOPramide (REGLAN) 10 MG tablet Take 1 tablet (10 mg total) by mouth 3 (three) times daily before meals. 30 tablet 0   . ondansetron (ZOFRAN ODT) 8 MG disintegrating tablet Take 1 tablet (8 mg total) by mouth every 8 (eight) hours as needed for nausea or vomiting. 20 tablet 0   . promethazine (PHENERGAN) 25 MG tablet Take 1 tablet (25 mg total) by mouth at bedtime as needed for nausea or vomiting. 30 tablet 0     I have reviewed patient's Past Medical Hx, Surgical Hx, Family Hx, Social Hx, medications and allergies.   ROS:  Review of Systems  Constitutional: Negative for chills and fever.  Respiratory: Negative for shortness of breath.   Gastrointestinal: Positive for abdominal pain.  Negative for constipation, diarrhea, nausea and vomiting.  Genitourinary: Positive for pelvic pain, vaginal bleeding and vaginal discharge.   Other systems negative  Physical Exam   Patient Vitals for the past 24 hrs:  BP Temp Temp src Pulse Resp Weight  01/26/19 0341 124/80 98.1 F (36.7 C) Oral 84 18 68 kg   Constitutional: Well-developed, well-nourished female in no acute distress.  Cardiovascular: normal rate and rhythm Respiratory: normal effort, clear to auscultation bilaterally GI: Abd soft, non-tender, gravid appropriate for gestational age.   No rebound or guarding. MS: Extremities nontender, no edema, normal ROM Neurologic: Alert and oriented x 4.  GU: Neg CVAT.  PELVIC EXAM: Cervix pink, visually closed, without lesion, small to moderate watery blood in vault, vaginal walls and external genitalia normal   Small pooling but no ferning.    Dilation: 3 Effacement (%): 60, 70 Cervical Position: Posterior, Middle Station: 0 Presentation: Vertex Exam by:: Jimmye Norman CNM   FHT:  Baseline 140/145 , moderate variability, accelerations present, no decelerations Contractions: q 2-4 mins Irregular     Labs: Results for orders placed or performed during the hospital encounter of 01/26/19 (from the past 24 hour(s))  Urinalysis, Routine w reflex microscopic     Status: Abnormal   Collection Time: 01/26/19  4:24 AM  Result Value Ref Range   Color, Urine YELLOW YELLOW   APPearance CLEAR CLEAR   Specific Gravity, Urine 1.005 1.005 - 1.030   pH 6.0 5.0 - 8.0   Glucose, UA NEGATIVE NEGATIVE mg/dL   Hgb urine dipstick LARGE (A) NEGATIVE   Bilirubin Urine NEGATIVE NEGATIVE   Ketones, ur NEGATIVE NEGATIVE mg/dL   Protein, ur NEGATIVE NEGATIVE mg/dL   Nitrite NEGATIVE NEGATIVE   Leukocytes,Ua LARGE (A) NEGATIVE   RBC / HPF 6-10 0 - 5 RBC/hpf   WBC, UA 6-10 0 - 5 WBC/hpf   Bacteria, UA RARE (A) NONE SEEN   Squamous Epithelial / LPF 0-5 0 - 5   Mucus PRESENT     Imaging:   Bedside US showed Twin A is vertex and Twin B is vertex with body going somewhat transverse across top.  But head is lower than body.  MAU Course/MDM: I have ordered labs and reviewed results.  NST reviewed, reactive for both twins. Consult Dr Philis Pique and Dr Harolyn Rutherford with presentation, exam findings and test results.  Will admit due to  advancing labor and bleeding. NICU MD notified and agrees.   Assessment: Twin gestation at 8259w4d Preterm labor with cervical change Vaginal bleeding in the third trimester  Plan: Admit to Labor and Delivery for observation and possible delivery Routine orders Betamethasone series MD to follow.  Wynelle BourgeoisMarie Brookelynn Hamor CNM, MSN Certified Nurse-Midwife 01/26/2019 5:47 AM

## 2019-01-26 NOTE — H&P (Signed)
Tammy Torres is a 28 y.o. female presenting for vaginal bleeding and contractions  28 yo G3P2002 @ 35+4 presents to MAU with vaginal bleeding anf LOF. Pt was pool + but fern negative. Bleeding was felt to be greater than expected for bloody show and the decision was made to admit the patient for obs. On admission her cervix was 3/60/-1. SHe changed to 4-5/90/0 and the decision was made to augment her labor OB History    Gravida  3   Para  2   Term  2   Preterm      AB      Living  2     SAB      TAB      Ectopic      Multiple      Live Births  2          Past Medical History:  Diagnosis Date  . Abnormal Pap smear   . Asthma    states rare use; however used this am (11/29/18)  . SVD (spontaneous vaginal delivery) 06/06/2013  . UTI (urinary tract infection)    Past Surgical History:  Procedure Laterality Date  . HERNIA REPAIR     Family History: family history is not on file. Social History:  reports that she has quit smoking. Her smoking use included cigarettes. She smoked 0.50 packs per day. She has never used smokeless tobacco. She reports current drug use. Drug: Marijuana. She reports that she does not drink alcohol.     Maternal Diabetes: No Genetic Screening: Declined Maternal Ultrasounds/Referrals: Normal Fetal Ultrasounds or other Referrals:  None Maternal Substance Abuse:  No Significant Maternal Medications:  None Significant Maternal Lab Results:  None Other Comments:  None  ROS History Dilation: 10 Effacement (%): 90 Station: Plus 3 Exam by:: Tammy Felling, RN Blood pressure 100/68, pulse 86, temperature 98.3 F (36.8 C), temperature source Oral, resp. rate 14, height 5\' 5"  (1.651 m), weight 68 kg, last menstrual period 06/21/2018, SpO2 100 %, unknown if currently breastfeeding. Exam Physical Exam  Prenatal labs: ABO, Rh: --/--/B POS, B POS Performed at [facility], [place] [address] [place], [place] [address]  [phone]) Antibody:  NEG (07/15 0600) Rubella:  immune RPR: Non Reactive (07/15 0620)  HBsAg:   NR HIV: Non Reactive (08/18 1237)  GBS:   Negative  Assessment/Plan: 1) Admit 2) Epidural 3) AROM when able 4) Anticipate svd   Vanessa Kick 01/26/2019, 3:56 PM

## 2019-01-26 NOTE — Anesthesia Preprocedure Evaluation (Signed)
Anesthesia Evaluation  Patient identified by MRN, date of birth, ID band Patient awake    Reviewed: Allergy & Precautions, Patient's Chart, lab work & pertinent test results  Airway Mallampati: I       Dental no notable dental hx.    Pulmonary asthma , former smoker,    Pulmonary exam normal        Cardiovascular negative cardio ROS Normal cardiovascular exam     Neuro/Psych    GI/Hepatic negative GI ROS,   Endo/Other    Renal/GU      Musculoskeletal   Abdominal   Peds  Hematology   Anesthesia Other Findings   Reproductive/Obstetrics (+) Pregnancy                             Anesthesia Physical Anesthesia Plan  ASA: II  Anesthesia Plan: Epidural   Post-op Pain Management:    Induction:   PONV Risk Score and Plan:   Airway Management Planned:   Additional Equipment:   Intra-op Plan:   Post-operative Plan:   Informed Consent: I have reviewed the patients History and Physical, chart, labs and discussed the procedure including the risks, benefits and alternatives for the proposed anesthesia with the patient or authorized representative who has indicated his/her understanding and acceptance.       Plan Discussed with: CRNA  Anesthesia Plan Comments: (COVID-19 Labs  No results for input(s): DDIMER, FERRITIN, LDH, CRP in the last 72 hours.  Lab Results      Component                Value               Date                      SARSCOV2NAA              NEGATIVE            01/26/2019            Lab Results      Component                Value               Date                      WBC                      12.2 (H)            01/26/2019                HGB                      10.3 (L)            01/26/2019                HCT                      33.9 (L)            01/26/2019                MCV                      86.9  01/26/2019                PLT                       171                 01/26/2019           )        Anesthesia Quick Evaluation

## 2019-01-26 NOTE — MAU Note (Addendum)
Pt reports to MAU c/o SROM @0300 . Pt reports she also had a lot of bleeding pt reports wearing a panty liner. +FM. Lots of pressure in her vagina occasional ctx.

## 2019-01-26 NOTE — MAU Note (Signed)
Report given to L&D charge Nurse. Pt to room 203.

## 2019-01-26 NOTE — Transfer of Care (Signed)
Immediate Anesthesia Transfer of Care Note  Patient: Tammy Torres  Procedure(s) Performed: CESAREAN SECTION (N/A ) VAGINAL DELIVERY (Vagina )  Patient Location: OR to L&D  Anesthesia Type:Epidural  Level of Consciousness: awake  Airway & Oxygen Therapy: Patient Spontanous Breathing  Post-op Assessment: Report given to RN  Post vital signs: Reviewed and stable  Last Vitals:  Vitals Value Taken Time  BP    Temp    Pulse    Resp    SpO2      Last Pain:  Vitals:   01/26/19 1401  TempSrc:   PainSc: 0-No pain      Patients Stated Pain Goal: 4 (46/50/35 4656)  Complications: No apparent anesthesia complications

## 2019-01-26 NOTE — Anesthesia Procedure Notes (Signed)
Epidural Patient location during procedure: OB Start time: 01/26/2019 10:11 AM End time: 01/26/2019 10:17 AM  Staffing Anesthesiologist: Effie Berkshire, MD Performed: anesthesiologist   Preanesthetic Checklist Completed: patient identified, site marked, surgical consent, pre-op evaluation, timeout performed, IV checked, risks and benefits discussed and monitors and equipment checked  Epidural Patient position: sitting Prep: ChloraPrep Patient monitoring: heart rate, continuous pulse ox and blood pressure Approach: midline Location: L3-L4 Injection technique: LOR saline  Needle:  Needle type: Tuohy  Needle gauge: 17 G Needle length: 9 cm Catheter type: closed end flexible Catheter size: 20 Guage Test dose: negative and 1.5% lidocaine  Assessment Events: blood not aspirated, injection not painful, no injection resistance and no paresthesia  Additional Notes LOR @ 4  Patient identified. Risks/Benefits/Options discussed with patient including but not limited to bleeding, infection, nerve damage, paralysis, failed block, incomplete pain control, headache, blood pressure changes, nausea, vomiting, reactions to medications, itching and postpartum back pain. Confirmed with bedside nurse the patient's most recent platelet count. Confirmed with patient that they are not currently taking any anticoagulation, have any bleeding history or any family history of bleeding disorders. Patient expressed understanding and wished to proceed. All questions were answered. Sterile technique was used throughout the entire procedure. Please see nursing notes for vital signs. Test dose was given through epidural catheter and negative prior to continuing to dose epidural or start infusion. Warning signs of high block given to the patient including shortness of breath, tingling/numbness in hands, complete motor block, or any concerning symptoms with instructions to call for help. Patient was given instructions  on fall risk and not to get out of bed. All questions and concerns addressed with instructions to call with any issues or inadequate analgesia.    Reason for block:procedure for pain

## 2019-01-26 NOTE — Progress Notes (Signed)
Patient to OR for vaginal delivery

## 2019-01-27 ENCOUNTER — Encounter (HOSPITAL_COMMUNITY): Payer: Self-pay | Admitting: Obstetrics and Gynecology

## 2019-01-27 LAB — CBC
HCT: 26.1 % — ABNORMAL LOW (ref 36.0–46.0)
Hemoglobin: 8.2 g/dL — ABNORMAL LOW (ref 12.0–15.0)
MCH: 27 pg (ref 26.0–34.0)
MCHC: 31.4 g/dL (ref 30.0–36.0)
MCV: 85.9 fL (ref 80.0–100.0)
Platelets: 149 10*3/uL — ABNORMAL LOW (ref 150–400)
RBC: 3.04 MIL/uL — ABNORMAL LOW (ref 3.87–5.11)
RDW: 13.4 % (ref 11.5–15.5)
WBC: 18.4 10*3/uL — ABNORMAL HIGH (ref 4.0–10.5)
nRBC: 0 % (ref 0.0–0.2)

## 2019-01-27 MED ORDER — FERROUS SULFATE 325 (65 FE) MG PO TABS
325.0000 mg | ORAL_TABLET | Freq: Two times a day (BID) | ORAL | Status: DC
  Administered 2019-01-27 – 2019-01-28 (×3): 325 mg via ORAL
  Filled 2019-01-27 (×3): qty 1

## 2019-01-27 NOTE — Progress Notes (Signed)
Patient is eating, ambulating, voiding.  Pain control is good.  Vitals:   01/26/19 1850 01/26/19 2312 01/27/19 0220 01/27/19 0646  BP: 115/89 124/76 122/77 125/86  Pulse: 81 63 65 66  Resp: 18 18 18 20   Temp: 98 F (36.7 C) 98.3 F (36.8 C) 97.7 F (36.5 C) 98.6 F (37 C)  TempSrc: Oral   Oral  SpO2:  97% 100% 100%  Weight:      Height:        Fundus firm Perineum without swelling.  Lab Results  Component Value Date   WBC 18.4 (H) 01/27/2019   HGB 8.2 (L) 01/27/2019   HCT 26.1 (L) 01/27/2019   MCV 85.9 01/27/2019   PLT 149 (L) 01/27/2019    --/--/B POS, B POS Performed at [facility], [place] [address], [place], [place] [address]  (07/15 0600)/RI  A/P Post partum day 1 from vaginal twin delivery.  Routine care.  Expect d/c tomorrow.  Iron.   Tammy Torres

## 2019-01-27 NOTE — Anesthesia Postprocedure Evaluation (Signed)
Anesthesia Post Note  Patient: Engineer, maintenance  Procedure(s) Performed: VAGINAL DELIVERY (Vagina )     Patient location during evaluation: Mother Baby Anesthesia Type: Epidural Level of consciousness: awake Pain management: satisfactory to patient Vital Signs Assessment: post-procedure vital signs reviewed and stable Respiratory status: spontaneous breathing Cardiovascular status: stable Anesthetic complications: no    Last Vitals:  Vitals:   01/27/19 0220 01/27/19 0646  BP: 122/77 125/86  Pulse: 65 66  Resp: 18 20  Temp: 36.5 C 37 C  SpO2: 100% 100%    Last Pain:  Vitals:   01/27/19 0646  TempSrc: Oral  PainSc:    Pain Goal: Patients Stated Pain Goal: 2 (01/27/19 3748)                 Casimer Lanius

## 2019-01-27 NOTE — Plan of Care (Signed)
Patient progressing well so far postpartum. No current concerns.

## 2019-01-27 NOTE — Clinical Social Work Maternal (Signed)
CLINICAL SOCIAL WORK MATERNAL/CHILD NOTE  Patient Details  Name: Tammy Torres MRN: [id number] Date of Birth: 11/05/1990  Date:  2018-07-24  Clinical Social Worker Initiating Note:  Elijio Miles Date/Time: Initiated:  01/27/19/1404     Child's Name:  Debe Coder and Buchanan Parents:  Mother, Father(Rachal Mcnellis and Maud Deed DOB: 02/26/1987)   Need for Interpreter:  None   Reason for Referral:  Current Substance Use/Substance Use During Pregnancy    Address:  [address] [place] [address]    Phone number:  [phone] (home)     Additional phone number:   Household Members/Support Persons (HM/SP):   Household Member/Support Person 1, Household Member/Support Person 2   HM/SP Name Relationship DOB or Age  HM/SP -1 Pia Mau Daughter 04/10/2010  HM/SP -2 Lucianne Lei Daughter 06/06/2013  HM/SP -3        HM/SP -4        HM/SP -5        HM/SP -6        HM/SP -7        HM/SP -8          Natural Supports (not living in the home):  Extended Family, Parent, Friends, Immediate Family   Professional Supports: Therapist(MOB reported she is active with a Transport planner)   Employment: Unemployed   Type of Work:     Education:  Programmer, systems   Homebound arranged:    Museum/gallery curator Resources:  Medicaid   Other Resources:  Physicist, medical , [place] Considerations Which May Impact Care:    Strengths:  Ability to meet basic needs , Home prepared for child , Pediatrician chosen   Psychotropic Medications:         Pediatrician:    [place] area  Pediatrician List:   [place] Other(Dr. Karleen Dolphin)  [place]      Pediatrician Fax Number:    Risk Factors/Current Problems:  Substance Use    Cognitive State:  Able to Concentrate , Alert , Linear Thinking    Mood/Affect:  Bright , Calm , Comfortable , Happy ,  Interested    CSW Assessment:  CSW received consult for history of THC use.  CSW met with MOB to offer support and complete assessment.    MOB sitting up in bed bottle feeding one of the infants with the other asleep in a basinet, when CSW entered the room. MOB's aunt Davy Pique also present and at bedside. CSW introduced self and received verbal permission to have aunt step out of the room to complete the assessment. MOB's aunt left voluntarily. CSW explained reason for consult to which MOB expressed understanding. MOB pleasant and engaged throughout assessment. MOB appropriate and attentive to infants throughout visit. MOB appeared to be in high spirits and reported being happy to have the twins. Per MOB, she currently lives with her two other daughters. MOB reported that she is not currently employed but that she receives [facility] and food stamps and is aware of the process to get infants added on to her plan.   CSW inquired about MOB's mental health history and MOB denied having any but did share with CSW that she saw a therapist during her pregnancy so that she would have someone to talk to. MOB stated she is unsure if she will return to therapy but stated she can if she chooses  to. MOB denied any PPD with her other two children but was receptive to education. CSW provided education regarding the baby blues period vs. perinatal mood disorders, discussed treatment and gave resources for mental health follow up if concerns arise.  CSW recommends self-evaluation during the postpartum time period using the New Mom Checklist from Postpartum Progress and encouraged MOB to contact a medical professional if symptoms are noted at any time. MOB denied any current SI, HI or DV and reported having a good support system consisting of her aunt, her mother and friends.   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 CSW informed MOB of [place]  and stated UDS and CDS were still pending but that a CPS report would be made, if warranted. MOB shared that they have not been able to get a good UDS because infants keep "pooping on the cotton balls" but that she continues to replace them. CSW inquired about if MOB had any CPS history and MOB shared she had a recent case opened because FOB's girlfriend called and made false accusations but reported case was closed after they did a home visit. CSW spoke to intake with [facility] CPS who reported MOB does have an open CPS case and that assigned worker is Kennith Center. CSW has attempted to reach out to Kimberlee Nearing regarding case. CSW awaiting return call.  MOB confirmed having all essential items for infants once discharged and stated infants would be sleeping in their own basinets once home. CSW provided review of Sudden Infant Death Syndrome (SIDS) precautions.    CSW aware UDS has not been collected but will continue to monitor CDS and await CPS return call.    CSW Plan/Description:  Sudden Infant Death Syndrome (SIDS) Education, Perinatal Mood and Anxiety Disorder (PMADs) Education, [place], CSW Will Continue to Monitor Umbilical Cord Tissue Drug Screen Results and Make Report if Foye Spurling, LCSWA 01-05-19, 3:00 PM

## 2019-01-28 MED ORDER — FERROUS SULFATE 325 (65 FE) MG PO TABS: 325.0000 mg | ORAL_TABLET | Freq: Two times a day (BID) | ORAL | 1 refills | Status: AC

## 2019-01-28 NOTE — Discharge Summary (Signed)
Obstetric Discharge Summary Reason for Admission: onset of labor Prenatal Procedures: ultrasound Intrapartum Procedures: spontaneous vaginal delivery Postpartum Procedures: none Complications-Operative and Postpartum: none Hemoglobin  Date Value Ref Range Status  01/27/2019 8.2 (L) 12.0 - 15.0 g/dL Final    Comment:    REPEATED TO VERIFY   HCT  Date Value Ref Range Status  01/27/2019 26.1 (L) 36.0 - 46.0 % Final    Physical Exam:  General: alert and cooperative Lochia: appropriate Uterine Fundus: firm DVT Evaluation: No evidence of DVT seen on physical exam.  Discharge Diagnoses: Term Pregnancy-delivered  Discharge Information: Date: 01/28/2019 Activity: pelvic rest Diet: routine Medications: PNV, Ibuprofen and Iron Condition: stable Instructions: refer to practice specific booklet Discharge to: home Follow-up Information    Bobbye Charleston, MD Follow up in 4 week(s).   Specialty: Obstetrics and Gynecology Contact information: [place] [address] [phone]           Newborn Data:   Agness, Sibrian [[id number]]  Live born female  Birth Weight: 4 lb 3 oz (1900 g) APGAR: 25, 9  Newborn Delivery   Birth date/time: 01/26/2019 15:28:00 Delivery type: Vaginal, Vacuum (Extractor)       Elzora, Cullins [[id number]]  Live born female  Birth Weight: 4 lb 10.4 oz (2110 g) APGAR: 8, 9  Newborn Delivery   Birth date/time: 01/26/2019 15:34:00 Delivery type: Vaginal, Vacuum (Extractor)      Baby may remain after discharge of mother.  Allyn Kenner 01/28/2019, 9:50 AM

## 2019-03-18 ENCOUNTER — Other Ambulatory Visit: Payer: Self-pay

## 2019-03-18 ENCOUNTER — Encounter (HOSPITAL_BASED_OUTPATIENT_CLINIC_OR_DEPARTMENT_OTHER): Payer: Self-pay | Admitting: *Deleted

## 2019-03-18 NOTE — Progress Notes (Signed)
Spoke w/ pt via phone for pre-op interview.  Npo after mn w/ exception clear liquids until 0830 then nothing by mouth, pt verbalized understanding.  Arrive at 1245.  Needs hg and urine preg.  Pre-op orders pending.  Pt getting covid test done 03-19-2019.

## 2019-03-19 ENCOUNTER — Other Ambulatory Visit (HOSPITAL_COMMUNITY)
Admission: RE | Admit: 2019-03-19 | Discharge: 2019-03-19 | Disposition: A | Discharge status: Home / Self Care | Payer: Medicaid Other | Source: Ambulatory Visit | Attending: Obstetrics and Gynecology | Admitting: Obstetrics and Gynecology

## 2019-03-19 DIAGNOSIS — Z20828 Contact with and (suspected) exposure to other viral communicable diseases: Secondary | ICD-10-CM | POA: Insufficient documentation

## 2019-03-19 DIAGNOSIS — Z01812 Encounter for preprocedural laboratory examination: Secondary | ICD-10-CM | POA: Insufficient documentation

## 2019-03-20 LAB — NOVEL CORONAVIRUS, NAA (HOSP ORDER, SEND-OUT TO REF LAB; TAT 18-24 HRS): SARS-CoV-2, NAA: NOT DETECTED

## 2019-03-22 ENCOUNTER — Other Ambulatory Visit: Payer: Self-pay | Admitting: Obstetrics and Gynecology

## 2019-03-22 NOTE — H&P (Signed)
28 y.o. [id number] desires permanent sterilization.   Past Medical History:  Diagnosis Date  . History of abnormal cervical Pap smear   . IDA (iron deficiency anemia)   . Mild asthma    Past Surgical History:  Procedure Laterality Date  . HERNIA REPAIR  age 89  . VAGINAL DELIVERY  01/26/2019   Procedure: VAGINAL DELIVERY;  Surgeon: Waynard Reedsoss, Kendra, MD;  Location: [facility];  Service: Obstetrics;;  vaginal delivery of twins    Social History   Socioeconomic History  . Marital status: Single    Spouse name: Not on file  . Number of children: Not on file  . Years of education: Not on file  . Highest education level: Not on file  Occupational History  . Not on file  Social Needs  . Financial resource strain: Not on file  . Food insecurity    Worry: Not on file    Inability: Not on file  . Transportation needs    Medical: Not on file    Non-medical: Not on file  Tobacco Use  . Smoking status: Former Smoker    Packs/day: 0.50    Types: Cigarettes    Quit date: 03/17/2017    Years since quitting: 2.0  . Smokeless tobacco: Never Used  Substance and Sexual Activity  . Alcohol use: No  . Drug use: Not Currently    Types: Marijuana  . Sexual activity: Not on file  Lifestyle  . Physical activity    Days per week: Not on file    Minutes per session: Not on file  . Stress: Not on file  Relationships  . Social Musicianconnections    Talks on phone: Not on file    Gets together: Not on file    Attends religious service: Not on file    Active member of club or organization: Not on file    Attends meetings of clubs or organizations: Not on file    Relationship status: Not on file  . Intimate partner violence    Fear of current or ex partner: Not on file    Emotionally abused: Not on file    Physically abused: Not on file    Forced sexual activity: Not on file  Other Topics Concern  . Not on file  Social History Narrative  . Not on file    No current facility-administered medications  on file prior to encounter.    Current Outpatient Medications on File Prior to Encounter  Medication Sig Dispense Refill  . albuterol (PROAIR HFA) 108 (90 BASE) MCG/ACT inhaler Inhale 2 puffs into the lungs every 4 (four) hours as needed for wheezing or shortness of breath. 2 puffs every 4 hours as needed only  if your can't catch your breath 1 Inhaler 2  . ferrous sulfate 325 (65 FE) MG tablet Take 1 tablet (325 mg total) by mouth 2 (two) times daily with a meal. 60 tablet 1  . Multiple Vitamin (ONE-A-DAY ESSENTIAL) TABS Take by mouth daily.      No Known Allergies  Vitals:   03/18/19 1707  Weight: 61.2 kg  Height: 5\' 5"  (1.651 m)    Lungs: clear to ascultation Cor:  RRR Abdomen:  soft, nontender, nondistended. Ex:  no cords, erythema Pelvic:   .   Vulva: no lesions, no tenderness, no mass .   Perineum: episiotomy/laceration well healed .   Vagina: no abnormal vaginal discharge (normal lochia), no tenderness, no vesicle(s) or ulcers .   Cervix: grossly normal,  no discharge, no cervical lacerations noted, no cervical motion tenderness .   Uterus: normal post partum size, normal shape, midline, mobile, non tender .   Bladder/Urethra: no urethral discharge, bladder non distended .   Adnexa/Parametria: no parametrial tenderness, no parametrial mass, no adnexal tenderness, no ovarian mass  A:  For laparoscopic  BTL with filschie clips.   P: P: All risks, benefits and alternatives d/w patient and she desires to proceed.  Patient has undergone a modified diet, ERAS protocol  and SCDs during the operation.   Tammy Torres

## 2019-03-23 ENCOUNTER — Other Ambulatory Visit: Payer: Self-pay

## 2019-03-23 ENCOUNTER — Ambulatory Visit (HOSPITAL_BASED_OUTPATIENT_CLINIC_OR_DEPARTMENT_OTHER): Payer: Medicaid Other | Admitting: Anesthesiology

## 2019-03-23 ENCOUNTER — Ambulatory Visit (HOSPITAL_BASED_OUTPATIENT_CLINIC_OR_DEPARTMENT_OTHER)
Admission: RE | Admit: 2019-03-23 | Discharge: 2019-03-23 | Disposition: A | Discharge status: Home / Self Care | Payer: Medicaid Other | Attending: Obstetrics and Gynecology | Admitting: Obstetrics and Gynecology

## 2019-03-23 ENCOUNTER — Encounter (HOSPITAL_BASED_OUTPATIENT_CLINIC_OR_DEPARTMENT_OTHER): Payer: Self-pay | Admitting: Anesthesiology

## 2019-03-23 ENCOUNTER — Encounter (HOSPITAL_BASED_OUTPATIENT_CLINIC_OR_DEPARTMENT_OTHER)
Admission: RE | Disposition: A | Discharge status: Home / Self Care | Payer: Self-pay | Source: Home / Self Care | Attending: Obstetrics and Gynecology

## 2019-03-23 DIAGNOSIS — Z87891 Personal history of nicotine dependence: Secondary | ICD-10-CM | POA: Insufficient documentation

## 2019-03-23 DIAGNOSIS — J45909 Unspecified asthma, uncomplicated: Secondary | ICD-10-CM | POA: Diagnosis not present

## 2019-03-23 DIAGNOSIS — Z302 Encounter for sterilization: Secondary | ICD-10-CM | POA: Insufficient documentation

## 2019-03-23 DIAGNOSIS — Z79899 Other long term (current) drug therapy: Secondary | ICD-10-CM | POA: Insufficient documentation

## 2019-03-23 DIAGNOSIS — D509 Iron deficiency anemia, unspecified: Secondary | ICD-10-CM | POA: Insufficient documentation

## 2019-03-23 HISTORY — DX: Iron deficiency anemia, unspecified: D50.9

## 2019-03-23 HISTORY — DX: Unspecified asthma, uncomplicated: J45.909

## 2019-03-23 HISTORY — DX: Personal history of other diseases of the female genital tract: Z87.42

## 2019-03-23 HISTORY — PX: LAPAROSCOPIC TUBAL LIGATION: SHX1937

## 2019-03-23 LAB — CBC
HCT: 43.1 % (ref 36.0–46.0)
Hemoglobin: 13.2 g/dL (ref 12.0–15.0)
MCH: 25.6 pg — ABNORMAL LOW (ref 26.0–34.0)
MCHC: 30.6 g/dL (ref 30.0–36.0)
MCV: 83.5 fL (ref 80.0–100.0)
Platelets: 235 10*3/uL (ref 150–400)
RBC: 5.16 MIL/uL — ABNORMAL HIGH (ref 3.87–5.11)
RDW: 15.2 % (ref 11.5–15.5)
WBC: 5.4 10*3/uL (ref 4.0–10.5)
nRBC: 0 % (ref 0.0–0.2)

## 2019-03-23 LAB — POCT PREGNANCY, URINE: Preg Test, Ur: NEGATIVE

## 2019-03-23 SURGERY — LIGATION, FALLOPIAN TUBE, LAPAROSCOPIC
Anesthesia: General | Site: Abdomen | Laterality: Bilateral

## 2019-03-23 MED ORDER — ROCURONIUM BROMIDE 10 MG/ML (PF) SYRINGE
PREFILLED_SYRINGE | INTRAVENOUS | Status: AC
  Filled 2019-03-23: qty 10

## 2019-03-23 MED ORDER — DEXAMETHASONE SODIUM PHOSPHATE 10 MG/ML IJ SOLN
INTRAMUSCULAR | Status: DC | PRN
  Administered 2019-03-23: 10 mg via INTRAVENOUS

## 2019-03-23 MED ORDER — SUGAMMADEX SODIUM 200 MG/2ML IV SOLN
INTRAVENOUS | Status: DC | PRN
  Administered 2019-03-23: 120 mg via INTRAVENOUS

## 2019-03-23 MED ORDER — CELECOXIB 200 MG PO CAPS
ORAL_CAPSULE | ORAL | Status: AC
  Filled 2019-03-23: qty 2

## 2019-03-23 MED ORDER — LABETALOL HCL 5 MG/ML IV SOLN
5.0000 mg | Freq: Once | INTRAVENOUS | Status: AC
  Administered 2019-03-23: 5 mg via INTRAVENOUS
  Filled 2019-03-23: qty 4

## 2019-03-23 MED ORDER — MEPERIDINE HCL 25 MG/ML IJ SOLN
6.2500 mg | INTRAMUSCULAR | Status: DC | PRN
  Filled 2019-03-23: qty 1

## 2019-03-23 MED ORDER — PROPOFOL 10 MG/ML IV BOLUS
INTRAVENOUS | Status: AC
  Filled 2019-03-23: qty 20

## 2019-03-23 MED ORDER — LACTATED RINGERS IV SOLN
INTRAVENOUS | Status: DC
  Filled 2019-03-23: qty 1000

## 2019-03-23 MED ORDER — GABAPENTIN 300 MG PO CAPS
300.0000 mg | ORAL_CAPSULE | ORAL | Status: AC
  Administered 2019-03-23: 300 mg via ORAL
  Filled 2019-03-23: qty 1

## 2019-03-23 MED ORDER — FENTANYL CITRATE (PF) 100 MCG/2ML IJ SOLN
INTRAMUSCULAR | Status: AC
  Filled 2019-03-23: qty 2

## 2019-03-23 MED ORDER — DEXAMETHASONE SODIUM PHOSPHATE 10 MG/ML IJ SOLN
INTRAMUSCULAR | Status: AC
  Filled 2019-03-23: qty 1

## 2019-03-23 MED ORDER — FENTANYL CITRATE (PF) 100 MCG/2ML IJ SOLN
25.0000 ug | INTRAMUSCULAR | Status: DC | PRN
  Filled 2019-03-23: qty 1

## 2019-03-23 MED ORDER — HYDROCODONE-ACETAMINOPHEN 7.5-325 MG PO TABS
ORAL_TABLET | ORAL | Status: AC
  Filled 2019-03-23: qty 1

## 2019-03-23 MED ORDER — PROPOFOL 10 MG/ML IV BOLUS
INTRAVENOUS | Status: DC | PRN
  Administered 2019-03-23: 150 mg via INTRAVENOUS
  Administered 2019-03-23: 50 mg via INTRAVENOUS

## 2019-03-23 MED ORDER — ACETAMINOPHEN 500 MG PO TABS
ORAL_TABLET | ORAL | Status: AC
  Filled 2019-03-23: qty 2

## 2019-03-23 MED ORDER — LACTATED RINGERS IV SOLN
INTRAVENOUS | Status: DC
  Administered 2019-03-23: 13:00:00 via INTRAVENOUS
  Filled 2019-03-23: qty 1000

## 2019-03-23 MED ORDER — ACETAMINOPHEN 500 MG PO TABS
1000.0000 mg | ORAL_TABLET | ORAL | Status: AC
  Administered 2019-03-23: 1000 mg via ORAL
  Filled 2019-03-23: qty 2

## 2019-03-23 MED ORDER — METOCLOPRAMIDE HCL 5 MG/ML IJ SOLN
10.0000 mg | Freq: Once | INTRAMUSCULAR | Status: DC | PRN
  Filled 2019-03-23: qty 2

## 2019-03-23 MED ORDER — CELECOXIB 400 MG PO CAPS
400.0000 mg | ORAL_CAPSULE | ORAL | Status: AC
  Administered 2019-03-23: 400 mg via ORAL
  Filled 2019-03-23: qty 1

## 2019-03-23 MED ORDER — HYDROCODONE-ACETAMINOPHEN 7.5-325 MG PO TABS
1.0000 | ORAL_TABLET | Freq: Once | ORAL | Status: AC | PRN
  Administered 2019-03-23: 1 via ORAL
  Filled 2019-03-23: qty 1

## 2019-03-23 MED ORDER — ROCURONIUM BROMIDE 50 MG/5ML IV SOSY
PREFILLED_SYRINGE | INTRAVENOUS | Status: DC | PRN
  Administered 2019-03-23: 50 mg via INTRAVENOUS

## 2019-03-23 MED ORDER — MIDAZOLAM HCL 2 MG/2ML IJ SOLN
INTRAMUSCULAR | Status: AC
  Filled 2019-03-23: qty 2

## 2019-03-23 MED ORDER — ONDANSETRON HCL 4 MG/2ML IJ SOLN
INTRAMUSCULAR | Status: DC | PRN
  Administered 2019-03-23: 4 mg via INTRAVENOUS

## 2019-03-23 MED ORDER — ONDANSETRON HCL 4 MG/2ML IJ SOLN
INTRAMUSCULAR | Status: AC
  Filled 2019-03-23: qty 2

## 2019-03-23 MED ORDER — GABAPENTIN 300 MG PO CAPS
ORAL_CAPSULE | ORAL | Status: AC
  Filled 2019-03-23: qty 1

## 2019-03-23 MED ORDER — SOD CITRATE-CITRIC ACID 500-334 MG/5ML PO SOLN
30.0000 mL | ORAL | Status: AC
  Administered 2019-03-23: 14:00:00 30 mL via ORAL
  Filled 2019-03-23: qty 30

## 2019-03-23 MED ORDER — SODIUM CHLORIDE 0.9 % IV SOLN
INTRAVENOUS | Status: DC | PRN
  Administered 2019-03-23: 15:00:00 60 mL

## 2019-03-23 MED ORDER — LIDOCAINE 2% (20 MG/ML) 5 ML SYRINGE
INTRAMUSCULAR | Status: DC | PRN
  Administered 2019-03-23: 100 mg via INTRAVENOUS

## 2019-03-23 MED ORDER — SOD CITRATE-CITRIC ACID 500-334 MG/5ML PO SOLN
ORAL | Status: AC
  Filled 2019-03-23: qty 30

## 2019-03-23 MED ORDER — LABETALOL HCL 5 MG/ML IV SOLN
INTRAVENOUS | Status: AC
  Filled 2019-03-23: qty 4

## 2019-03-23 MED ORDER — FENTANYL CITRATE (PF) 100 MCG/2ML IJ SOLN
INTRAMUSCULAR | Status: DC | PRN
  Administered 2019-03-23: 100 ug via INTRAVENOUS
  Administered 2019-03-23: 50 ug via INTRAVENOUS

## 2019-03-23 MED ORDER — MIDAZOLAM HCL 5 MG/5ML IJ SOLN
INTRAMUSCULAR | Status: DC | PRN
  Administered 2019-03-23: 2 mg via INTRAVENOUS

## 2019-03-23 MED ORDER — LIDOCAINE 2% (20 MG/ML) 5 ML SYRINGE
INTRAMUSCULAR | Status: AC
  Filled 2019-03-23: qty 5

## 2019-03-23 SURGICAL SUPPLY — 26 items
ADH SKN CLS APL DERMABOND .7 (GAUZE/BANDAGES/DRESSINGS) ×1
CATH ROBINSON RED A/P 16FR (CATHETERS) ×3 IMPLANT
CLIP FILSHIE TUBAL LIGA STRL (Clip) ×5 IMPLANT
DERMABOND ADVANCED (GAUZE/BANDAGES/DRESSINGS) ×2
DERMABOND ADVANCED .7 DNX12 (GAUZE/BANDAGES/DRESSINGS) ×1 IMPLANT
DRSG OPSITE POSTOP 3X4 (GAUZE/BANDAGES/DRESSINGS) ×4 IMPLANT
DURAPREP 26ML APPLICATOR (WOUND CARE) ×3 IMPLANT
GLOVE BIO SURGEON STRL SZ7 (GLOVE) ×6 IMPLANT
GLOVE BIOGEL PI IND STRL 7.0 (GLOVE) ×1 IMPLANT
GLOVE BIOGEL PI INDICATOR 7.0 (GLOVE) ×2
GOWN STRL REUS W/ TWL LRG LVL3 (GOWN DISPOSABLE) ×3 IMPLANT
GOWN STRL REUS W/TWL LRG LVL3 (GOWN DISPOSABLE) ×9
NDL INSUFFLATION 14GA 120MM (NEEDLE) ×1 IMPLANT
NEEDLE INSUFFLATION 14GA 120MM (NEEDLE) ×3 IMPLANT
PACK LAPAROSCOPY BASIN (CUSTOM PROCEDURE TRAY) ×3 IMPLANT
PACK TRENDGUARD 450 HYBRID PRO (MISCELLANEOUS) IMPLANT
PROTECTOR NERVE ULNAR (MISCELLANEOUS) ×6 IMPLANT
SET TUBE SMOKE EVAC HIGH FLOW (TUBING) ×3 IMPLANT
SUT VIC AB 2-0 UR6 27 (SUTURE) ×3 IMPLANT
SUT VICRYL RAPIDE 3 0 (SUTURE) ×3 IMPLANT
SYR 50ML LL SCALE MARK (SYRINGE) ×3 IMPLANT
SYR 5ML LL (SYRINGE) ×3 IMPLANT
TRENDGUARD 450 HYBRID PRO PACK (MISCELLANEOUS) ×3
TROCAR OPTI TIP 5M 100M (ENDOMECHANICALS) IMPLANT
TROCAR XCEL DIL TIP R 11M (ENDOMECHANICALS) ×3 IMPLANT
WARMER LAPAROSCOPE (MISCELLANEOUS) ×3 IMPLANT

## 2019-03-23 NOTE — Anesthesia Preprocedure Evaluation (Signed)
Anesthesia Evaluation  Patient identified by MRN, date of birth, ID band Patient awake    Reviewed: Allergy & Precautions, NPO status , Patient's Chart, lab work & pertinent test results  Airway Mallampati: I  TM Distance: >3 FB Neck ROM: Full    Dental no notable dental hx. (+) Teeth Intact   Pulmonary asthma , former smoker,    Pulmonary exam normal breath sounds clear to auscultation       Cardiovascular negative cardio ROS Normal cardiovascular exam Rhythm:Regular Rate:Normal     Neuro/Psych negative neurological ROS  negative psych ROS   GI/Hepatic negative GI ROS, Neg liver ROS,   Endo/Other  negative endocrine ROS  Renal/GU negative Renal ROS  negative genitourinary   Musculoskeletal negative musculoskeletal ROS (+)   Abdominal (+) - obese,   Peds  Hematology  (+) anemia ,   Anesthesia Other Findings   Reproductive/Obstetrics Desires sterilization                             Anesthesia Physical Anesthesia Plan  ASA: II  Anesthesia Plan: General   Post-op Pain Management:    Induction: Intravenous  PONV Risk Score and Plan: 4 or greater and Scopolamine patch - Pre-op, Midazolam, Ondansetron, Dexamethasone and Treatment may vary due to age or medical condition  Airway Management Planned: Oral ETT  Additional Equipment:   Intra-op Plan:   Post-operative Plan: Extubation in OR  Informed Consent: I have reviewed the patients History and Physical, chart, labs and discussed the procedure including the risks, benefits and alternatives for the proposed anesthesia with the patient or authorized representative who has indicated his/her understanding and acceptance.     Dental advisory given  Plan Discussed with: CRNA and Surgeon  Anesthesia Plan Comments:         Anesthesia Quick Evaluation

## 2019-03-23 NOTE — Discharge Instructions (Signed)
Motrin,Aleve,Ibuprofin after 730 pm as needed   Post Anesthesia Home Care Instructions  Activity: Get plenty of rest for the remainder of the day. A responsible individual must stay with you for 24 hours following the procedure.  For the next 24 hours, DO NOT: -Drive a car -Paediatric nurse -Drink alcoholic beverages -Take any medication unless instructed by your physician -Make any legal decisions or sign important papers.  Meals: Start with liquid foods such as gelatin or soup. Progress to regular foods as tolerated. Avoid greasy, spicy, heavy foods. If nausea and/or vomiting occur, drink only clear liquids until the nausea and/or vomiting subsides. Call your physician if vomiting continues.  Special Instructions/Symptoms: Your throat may feel dry or sore from the anesthesia or the breathing tube placed in your throat during surgery. If this causes discomfort, gargle with warm salt water. The discomfort should disappear within 24 hours.  If you had a scopolamine patch placed behind your ear for the management of post- operative nausea and/or vomiting:  1. The medication in the patch is effective for 72 hours, after which it should be removed.  Wrap patch in a tissue and discard in the trash. Wash hands thoroughly with soap and water. 2. You may remove the patch earlier than 72 hours if you experience unpleasant side effects which may include dry mouth, dizziness or visual disturbances. 3. Avoid touching the patch. Wash your hands with soap and water after contact with the patch.

## 2019-03-23 NOTE — Transfer of Care (Signed)
Immediate Anesthesia Transfer of Care Note  Patient: Tammy Torres  Procedure(s) Performed: LAPAROSCOPIC TUBAL LIGATION (Bilateral Abdomen)  Patient Location: PACU  Anesthesia Type:General  Level of Consciousness: sedated  Airway & Oxygen Therapy: Patient Spontanous Breathing and Patient connected to nasal cannula oxygen  Post-op Assessment: Report given to RN  Post vital signs: Reviewed 131/96, back to baselline Last Vitals:  Vitals Value Taken Time  BP    Temp    Pulse 79 03/23/19 1532  Resp 17 03/23/19 1532  SpO2 100 % 03/23/19 1532  Vitals shown include unvalidated device data.  Last Pain:  Vitals:   03/23/19 1342  TempSrc: Oral  PainSc: 0-No pain      Patients Stated Pain Goal: 6 (32/44/01 0272)  Complications: No apparent anesthesia complications

## 2019-03-23 NOTE — Anesthesia Procedure Notes (Signed)
Procedure Name: Intubation Date/Time: 03/23/2019 2:49 PM Performed by: Bonney Aid, CRNA Pre-anesthesia Checklist: Patient identified, Emergency Drugs available, Suction available and Patient being monitored Patient Re-evaluated:Patient Re-evaluated prior to induction Oxygen Delivery Method: Circle system utilized Preoxygenation: Pre-oxygenation with 100% oxygen Induction Type: IV induction Ventilation: Mask ventilation without difficulty Laryngoscope Size: Mac and 3 Grade View: Grade I Tube type: Oral Tube size: 7.0 mm Number of attempts: 1 Airway Equipment and Method: Stylet and Oral airway Placement Confirmation: ETT inserted through vocal cords under direct vision,  positive ETCO2 and breath sounds checked- equal and bilateral Secured at: 22 cm Tube secured with: Tape Dental Injury: Teeth and Oropharynx as per pre-operative assessment

## 2019-03-23 NOTE — Anesthesia Postprocedure Evaluation (Signed)
Anesthesia Post Note  Patient: Engineer, maintenance  Procedure(s) Performed: LAPAROSCOPIC TUBAL LIGATION (Bilateral Abdomen)     Patient location during evaluation: PACU Anesthesia Type: General Level of consciousness: awake and alert and oriented Pain management: pain level controlled Vital Signs Assessment: post-procedure vital signs reviewed and stable Respiratory status: spontaneous breathing, nonlabored ventilation and respiratory function stable Cardiovascular status: blood pressure returned to baseline and stable Postop Assessment: no apparent nausea or vomiting Anesthetic complications: no    Last Vitals:  Vitals:   03/23/19 1615 03/23/19 1621  BP: (!) 156/101 (!) 150/113  Pulse: 63 74  Resp: 18 18  Temp:    SpO2: 100% 100%    Last Pain:  Vitals:   03/23/19 1600  TempSrc:   PainSc: (P) Asleep                 Kirsty Monjaraz A.

## 2019-03-23 NOTE — Brief Op Note (Signed)
03/23/2019  3:18 PM  PATIENT:  Tammy Torres  28 y.o. female  PRE-OPERATIVE DIAGNOSIS:  DESIRES STERILIZATION  POST-OPERATIVE DIAGNOSIS:  desires sterilization  PROCEDURE:  Procedure(s): LAPAROSCOPIC TUBAL LIGATION (Bilateral)  SURGEON:  Surgeon(s) and Role:    * Bobbye Charleston, MD - Primary  ANESTHESIA:   general  EBL:  5 mL   LOCAL MEDICATIONS USED:  OTHER Ropivicaine intraperitoneal  SPECIMEN:  No Specimen  DISPOSITION OF SPECIMEN:  N/A  COUNTS:  YES  TOURNIQUET:  * No tourniquets in log *  DICTATION: .Note written in EPIC  PLAN OF CARE: Discharge to home after PACU  PATIENT DISPOSITION:  PACU - hemodynamically stable.   Delay start of Pharmacological VTE agent (>24hrs) due to surgical blood loss or risk of bleeding: not applicable

## 2019-03-23 NOTE — Progress Notes (Signed)
Pt noted to have continued high BP in phase II recovery. 190/112. Pain medication given and pt assisted to the bathroom with no change in BP. Dr Royce Macadamia made aware. Labetolol 5mg  IV ordered and given to the patient.

## 2019-03-23 NOTE — Op Note (Signed)
03/23/2019  3:18 PM  PATIENT:  Tammy Torres  28 y.o. female  PRE-OPERATIVE DIAGNOSIS:  DESIRES STERILIZATION  POST-OPERATIVE DIAGNOSIS:  desires sterilization  PROCEDURE:  Procedure(s): LAPAROSCOPIC TUBAL LIGATION (Bilateral)  SURGEON:  Surgeon(s) and Role:    * Bobbye Charleston, MD - Primary  ANESTHESIA:   general  EBL:  5 mL   LOCAL MEDICATIONS USED:  OTHER Ropivicaine intraperitoneal  SPECIMEN:  No Specimen  DISPOSITION OF SPECIMEN:  N/A  COUNTS:  YES  TOURNIQUET:  * No tourniquets in log *  DICTATION: .Note written in EPIC  PLAN OF CARE: Discharge to home after PACU  PATIENT DISPOSITION:  PACU - hemodynamically stable.   Delay start of Pharmacological VTE agent (>24hrs) due to surgical blood loss or risk of bleeding: not applicable  Meds: none  Complications:  None.  Findings: normal uterus, tubes and ovaries.  Small adhesion of ovary to omentum left in place- not taut.   Technique  After adequate general endotracheal anesthesia was achieved, the patient was prepped and draped in the usual sterile fashion.  A 2 cm incision was made just below the umbilicus and the abdominal wall tented up.  The Veress needle was inserted at a 45 degree angle to the pelvis and no bowel contents or blood were aspirated.  The abdomen was insufflated and the 12 mm trocar placed without complication.  The operative scope was introduced and the above findings noted.  The filchie clip instrument was introduced and the tube were followed to their fimbriated ends bilaterally.  A filchie clip was placed on the isthmic portion of each tube and confirmed visually to be around the entire circumference of each tube.  After a quick inspection of the pelvis and liver edge, all instruments were removed and the abdomen was desufflated.  The 12 mm faschial incision was closed with a figure of eight stitch of 2-vicryl and the skin was closed with dermabond.  All instruments were removed from the vagina  and the patient returned to the PACU in stable condition.  Aleshia Cartelli A

## 2019-03-23 NOTE — Progress Notes (Signed)
There has been no change in the patients history, status or exam since the history and physical.  Vitals:   03/18/19 1707  Weight: 61.2 kg  Height: 5\' 5"  (1.651 m)    Results for orders placed or performed during the hospital encounter of 03/23/19 (from the past 72 hour(s))  Pregnancy, urine POC     Status: None   Collection Time: 03/23/19 12:51 PM  Result Value Ref Range   Preg Test, Ur NEGATIVE NEGATIVE    Comment:        THE SENSITIVITY OF THIS METHODOLOGY IS >24 mIU/mL     Daria Pastures

## 2019-03-24 ENCOUNTER — Encounter (HOSPITAL_BASED_OUTPATIENT_CLINIC_OR_DEPARTMENT_OTHER): Payer: Self-pay | Admitting: Obstetrics and Gynecology

## 2020-07-24 IMAGING — US US OB TRANSVAGINAL
1 series · 14 of 28 positions shown · non-contrast
Comparison: None.

CLINICAL DATA: Pelvic pain for a few days. Pregnant patient.
Quantitative beta HCG level greater than 6666.

EXAM:
TRIPLET OBSTETRIC <14WK ULTRASOUND AND TRANSVAGINAL OB US

[Series 1: us ob transvaginal · 14 of 109 slices shown]
[im 5/109]
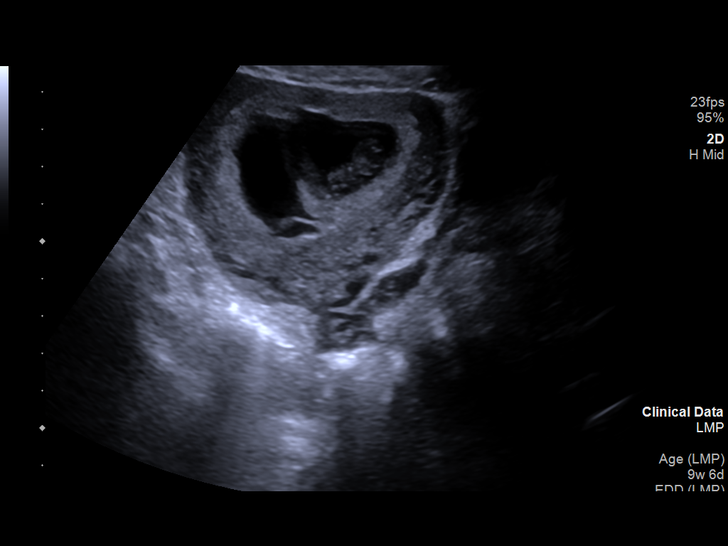
[im 13/109]
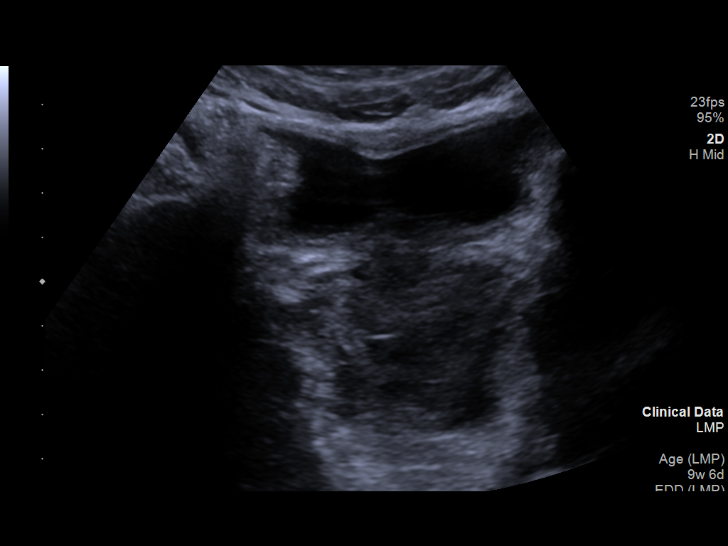
[im 21/109]
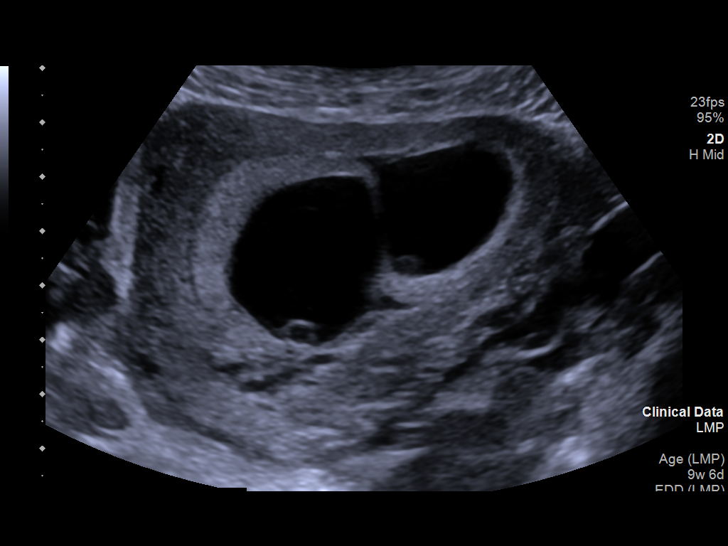
[im 29/109]
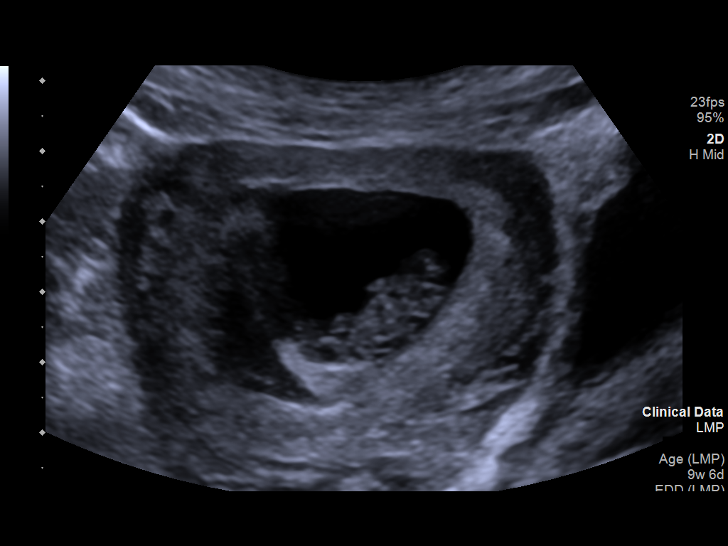
[im 37/109]
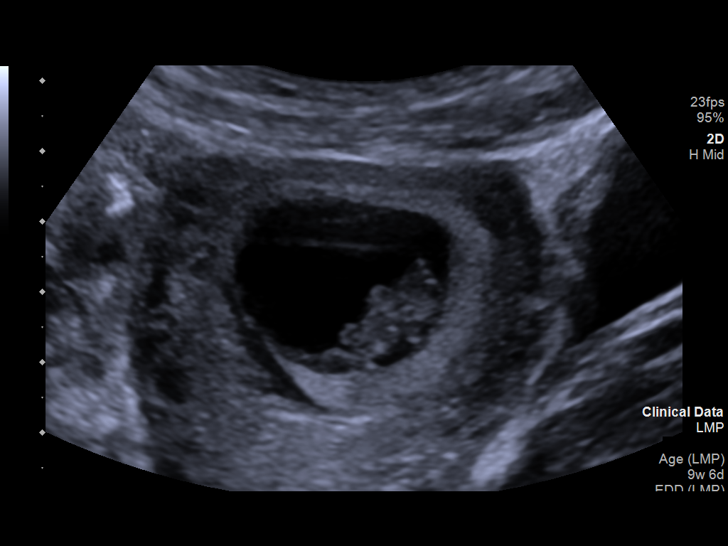
[im 45/109]
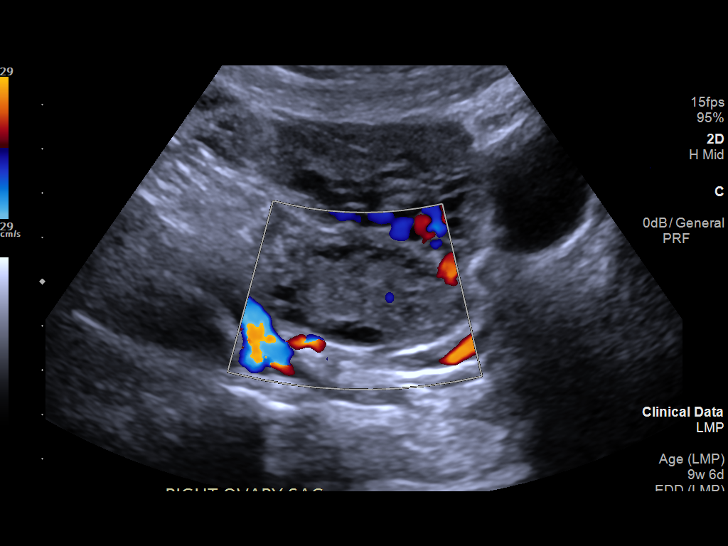
[im 53/109]
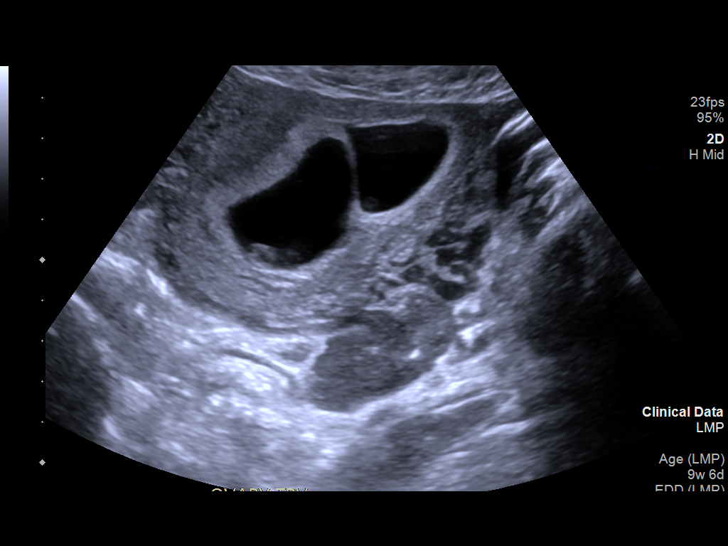
[im 61/109]
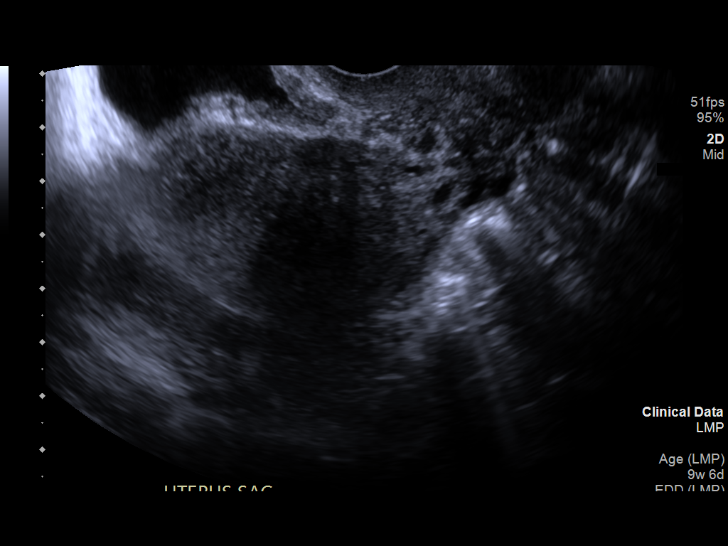
[im 69/109]
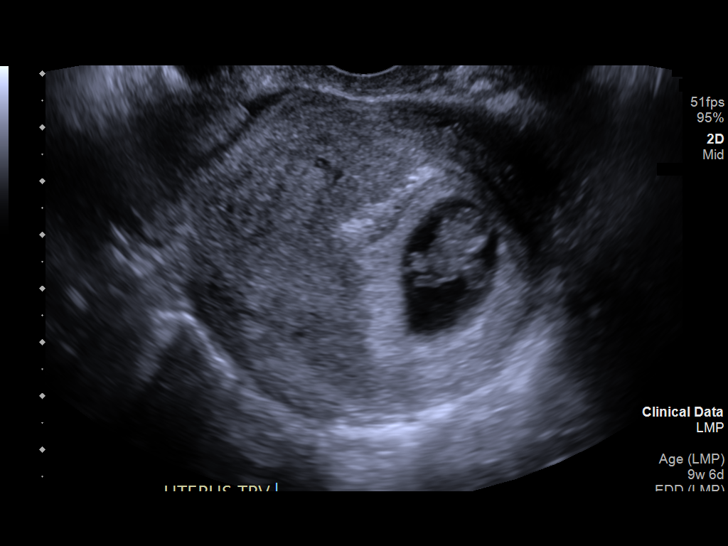
[im 77/109]
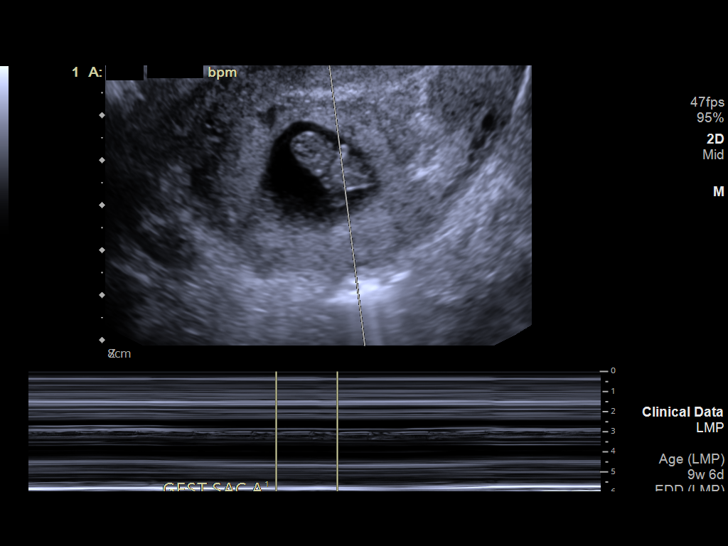
[im 85/109]
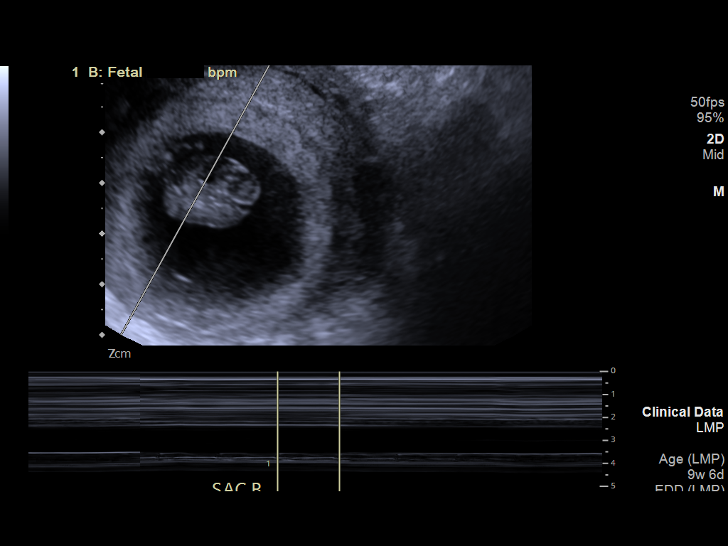
[im 93/109]
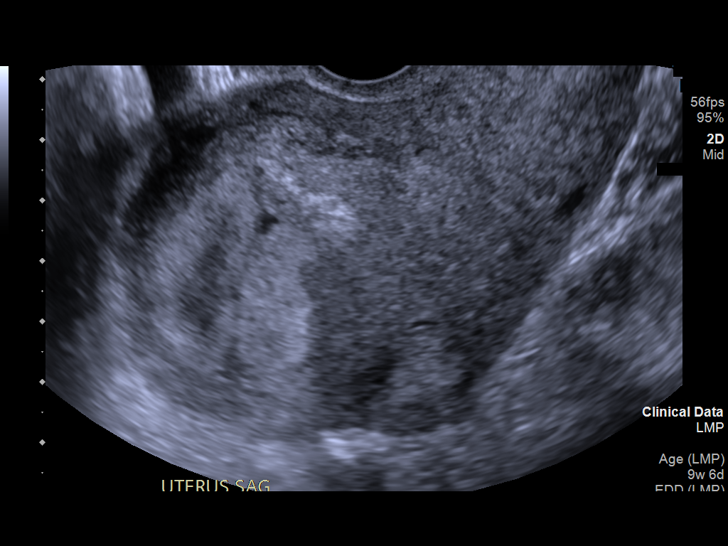
[im 101/109]
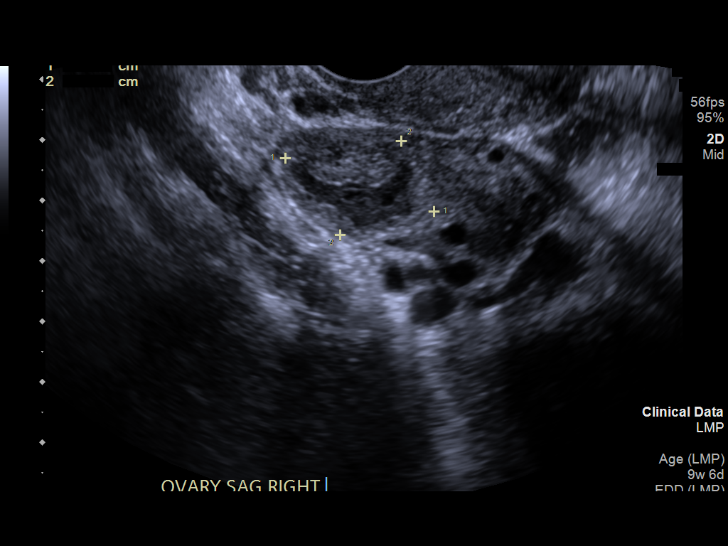
[im 109/109]
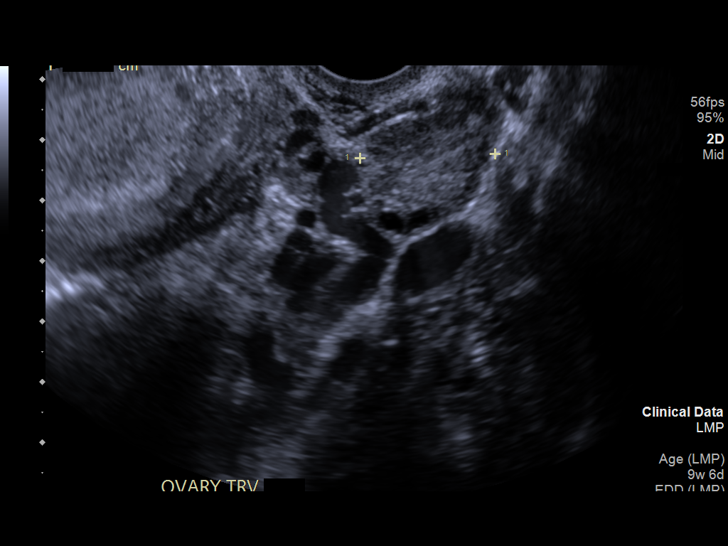

[14 of 28 positions shown; findings below may reference images not displayed]

FINDINGS: Number of IUPs:  2

TWIN 1

Yolk sac:  Visualized.

Embryo:  Visualized.

Cardiac Activity: Visualized.

Heart Rate: 168 bpm

CRL: 20.6 mm.  8w 5d

TWIN 2

Yolk sac:  Visualized.

Embryo:  Visualized.

Cardiac Activity: Visualized.

Heart Rate: 165 bpm

CRL: 19.9 mm 8w 4d

Subchorionic hemorrhage:  Small subarachnoid hemorrhage.

Maternal uterus/adnexae:

No uterine masses. Cervix is closed. Ovaries and adnexa are
unremarkable. No abnormal pelvic free fluid.
IMPRESSION: 1. Live twin intrauterine pregnancy, which appears diamniotic and
dichorionic. Small subarachnoid hemorrhage. Symmetric sizes of the
embryos, measured ages of 8 weeks and 5 days and 8 weeks and 4 days.
No other pregnancy complication.
2. No ovarian or adnexal abnormality. No abnormal pelvic free fluid.

## 2021-10-28 ENCOUNTER — Encounter: Payer: Self-pay | Admitting: Adult Health Nurse Practitioner

## 2022-05-25 ENCOUNTER — Other Ambulatory Visit: Payer: Self-pay

## 2022-05-25 ENCOUNTER — Encounter (HOSPITAL_BASED_OUTPATIENT_CLINIC_OR_DEPARTMENT_OTHER): Payer: Self-pay

## 2022-05-25 ENCOUNTER — Emergency Department (HOSPITAL_BASED_OUTPATIENT_CLINIC_OR_DEPARTMENT_OTHER)
Admission: EM | Admit: 2022-05-25 | Discharge: 2022-05-26 | Disposition: A | Discharge status: Home / Self Care | Payer: Medicaid Other | Attending: Emergency Medicine | Admitting: Emergency Medicine

## 2022-05-25 DIAGNOSIS — R1013 Epigastric pain: Secondary | ICD-10-CM | POA: Diagnosis present

## 2022-05-25 DIAGNOSIS — K2920 Alcoholic gastritis without bleeding: Secondary | ICD-10-CM | POA: Diagnosis not present

## 2022-05-25 LAB — URINALYSIS, ROUTINE W REFLEX MICROSCOPIC
Bilirubin Urine: NEGATIVE
Glucose, UA: NEGATIVE mg/dL
Ketones, ur: 40 mg/dL — AB
Leukocytes,Ua: NEGATIVE
Nitrite: NEGATIVE
Specific Gravity, Urine: 1.024 (ref 1.005–1.030)
pH: 7.5 (ref 5.0–8.0)

## 2022-05-25 LAB — CBC
HCT: 39.4 % (ref 36.0–46.0)
Hemoglobin: 13 g/dL (ref 12.0–15.0)
MCH: 28.1 pg (ref 26.0–34.0)
MCHC: 33 g/dL (ref 30.0–36.0)
MCV: 85.3 fL (ref 80.0–100.0)
Platelets: 216 10*3/uL (ref 150–400)
RBC: 4.62 MIL/uL (ref 3.87–5.11)
RDW: 13.8 % (ref 11.5–15.5)
WBC: 10.5 10*3/uL (ref 4.0–10.5)
nRBC: 0 % (ref 0.0–0.2)

## 2022-05-25 LAB — COMPREHENSIVE METABOLIC PANEL
ALT: 15 U/L (ref 0–44)
AST: 30 U/L (ref 15–41)
Albumin: 4.8 g/dL (ref 3.5–5.0)
Alkaline Phosphatase: 47 U/L (ref 38–126)
Anion gap: 15 (ref 5–15)
BUN: 8 mg/dL (ref 6–20)
CO2: 15 mmol/L — ABNORMAL LOW (ref 22–32)
Calcium: 9.4 mg/dL (ref 8.9–10.3)
Chloride: 103 mmol/L (ref 98–111)
Creatinine, Ser: 0.6 mg/dL (ref 0.44–1.00)
GFR, Estimated: >60 mL/min (ref >60)
Glucose, Bld: 102 mg/dL — ABNORMAL HIGH (ref 70–99)
Potassium: 3.9 mmol/L (ref 3.5–5.1)
Sodium: 133 mmol/L — ABNORMAL LOW (ref 135–145)
Total Bilirubin: 0.7 mg/dL (ref 0.3–1.2)
Total Protein: 7.9 g/dL (ref 6.5–8.1)

## 2022-05-25 LAB — PREGNANCY, URINE: Preg Test, Ur: NEGATIVE

## 2022-05-25 LAB — LIPASE, BLOOD: Lipase: 21 U/L (ref 11–51)

## 2022-05-25 MED ORDER — ACETAMINOPHEN 500 MG PO TABS
1000.0000 mg | ORAL_TABLET | Freq: Once | ORAL | Status: AC
  Administered 2022-05-25: 1000 mg via ORAL
  Filled 2022-05-25: qty 2

## 2022-05-25 MED ORDER — ALUM & MAG HYDROXIDE-SIMETH 200-200-20 MG/5ML PO SUSP
15.0000 mL | Freq: Once | ORAL | Status: AC
  Administered 2022-05-26: 15 mL via ORAL
  Filled 2022-05-25: qty 30

## 2022-05-25 MED ORDER — SODIUM CHLORIDE 0.9 % IV BOLUS
1000.0000 mL | Freq: Once | INTRAVENOUS | Status: AC
  Administered 2022-05-26: 1000 mL via INTRAVENOUS

## 2022-05-25 MED ORDER — FAMOTIDINE IN NACL 20-0.9 MG/50ML-% IV SOLN
20.0000 mg | Freq: Once | INTRAVENOUS | Status: AC
  Administered 2022-05-26: 20 mg via INTRAVENOUS
  Filled 2022-05-25: qty 50

## 2022-05-25 MED ORDER — LACTATED RINGERS IV BOLUS
1000.0000 mL | Freq: Once | INTRAVENOUS | Status: AC
  Administered 2022-05-25: 1000 mL via INTRAVENOUS

## 2022-05-25 MED ORDER — KETOROLAC TROMETHAMINE 15 MG/ML IJ SOLN
15.0000 mg | Freq: Once | INTRAMUSCULAR | Status: AC
  Administered 2022-05-25: 15 mg via INTRAVENOUS
  Filled 2022-05-25: qty 1

## 2022-05-25 MED ORDER — ONDANSETRON 4 MG PO TBDP
4.0000 mg | ORAL_TABLET | Freq: Once | ORAL | Status: AC | PRN
  Administered 2022-05-25: 4 mg via ORAL
  Filled 2022-05-25: qty 1

## 2022-05-25 MED ORDER — ONDANSETRON HCL 4 MG/2ML IJ SOLN
4.0000 mg | Freq: Once | INTRAMUSCULAR | Status: AC
  Administered 2022-05-26: 4 mg via INTRAVENOUS
  Filled 2022-05-25: qty 2

## 2022-05-25 NOTE — ED Notes (Signed)
Patient requested RN assist in lobby, states she feels faint. Able to transfer to a WC, VS checked (see flowsheet). Zofran offered and accepted. Returned to lobby as no indication was found to increase acuity

## 2022-05-25 NOTE — ED Triage Notes (Signed)
Pt states that she was out partying and drinking last night and now is having RUQ and LUQ abd pain. Pt also reports emesis and BIL extremety numbness.

## 2022-05-25 NOTE — ED Provider Notes (Signed)
[facility] Provider Note   CSN: [id number] Arrival date & time: 05/25/22  1940     History {Add pertinent medical, surgical, social history, OB history to HPI:1} Chief Complaint  Patient presents with   Abdominal Pain   Emesis    Tammy Torres is a 31 y.o. female.  Patient is a 31 year old female presenting presenting for abdominal pain.  Patient admits to epigastric abdominal pain, without radiation, that started yesterday after a night of drinking.  She admits to nausea and vomiting that has occurred several times throughout the day.  Denies any fevers or diarrhea.  Denies any sick contacts.  The history is provided by the patient. No language interpreter was used.  Abdominal Pain Associated symptoms: nausea and vomiting   Associated symptoms: no chest pain, no chills, no cough, no diarrhea, no dysuria, no fever, no hematuria, no shortness of breath and no sore throat   Emesis Associated symptoms: abdominal pain   Associated symptoms: no arthralgias, no chills, no cough, no diarrhea, no fever and no sore throat        Home Medications Prior to Admission medications   Medication Sig Start Date End Date Taking? Authorizing Provider  albuterol (PROAIR HFA) 108 (90 BASE) MCG/ACT inhaler Inhale 2 puffs into the lungs every 4 (four) hours as needed for wheezing or shortness of breath. 2 puffs every 4 hours as needed only  if your can't catch your breath 05/31/13   Tanda Rockers, MD  ferrous sulfate 325 (65 FE) MG tablet Take 1 tablet (325 mg total) by mouth 2 (two) times daily with a meal. 01/28/19   Allyn Kenner, DO  Multiple Vitamin (ONE-A-DAY ESSENTIAL) TABS Take by mouth daily.    [provider]      Allergies    Patient has no known allergies.    Review of Systems   Review of Systems  Constitutional:  Negative for chills and fever.  HENT:  Negative for ear pain and sore throat.   Eyes:  Negative for pain and visual  disturbance.  Respiratory:  Negative for cough and shortness of breath.   Cardiovascular:  Negative for chest pain and palpitations.  Gastrointestinal:  Positive for abdominal pain, nausea and vomiting. Negative for diarrhea.  Genitourinary:  Negative for dysuria and hematuria.  Musculoskeletal:  Negative for arthralgias and back pain.  Skin:  Negative for color change and rash.  Neurological:  Negative for seizures and syncope.  All other systems reviewed and are negative.   Physical Exam Updated Vital Signs BP 118/84   Pulse 86   Temp 99.5 F (37.5 C)   Resp 18   Ht 5\' 5"  (1.651 m)   Wt 62.1 kg   LMP 05/21/2022 (Approximate)   SpO2 99%   BMI 22.80 kg/m  Physical Exam Vitals and nursing note reviewed.  Constitutional:      General: She is not in acute distress.    Appearance: She is well-developed.  HENT:     Head: Normocephalic and atraumatic.  Eyes:     Conjunctiva/sclera: Conjunctivae normal.  Cardiovascular:     Rate and Rhythm: Normal rate and regular rhythm.     Heart sounds: No murmur heard. Pulmonary:     Effort: Pulmonary effort is normal. No respiratory distress.     Breath sounds: Normal breath sounds.  Abdominal:     Palpations: Abdomen is soft.     Tenderness: There is abdominal tenderness in the epigastric area.  Musculoskeletal:  General: No swelling.     Cervical back: Neck supple.  Skin:    General: Skin is warm and dry.     Capillary Refill: Capillary refill takes less than 2 seconds.  Neurological:     Mental Status: She is alert.  Psychiatric:        Mood and Affect: Mood normal.     ED Results / Procedures / Treatments   Labs (all labs ordered are listed, but only abnormal results are displayed) Labs Reviewed  COMPREHENSIVE METABOLIC PANEL - Abnormal; Notable for the following components:      Result Value   Sodium 133 (*)    CO2 15 (*)    Glucose, Bld 102 (*)    All other components within normal limits  URINALYSIS, ROUTINE  W REFLEX MICROSCOPIC - Abnormal; Notable for the following components:   Hgb urine dipstick SMALL (*)    Ketones, ur 40 (*)    Protein, ur TRACE (*)    All other components within normal limits  LIPASE, BLOOD  CBC  PREGNANCY, URINE    EKG None  Radiology No results found.  Procedures Procedures  {Document cardiac monitor, telemetry assessment procedure when appropriate:1}  Medications Ordered in ED Medications  sodium chloride 0.9 % bolus 1,000 mL (has no administration in time range)  famotidine (PEPCID) IVPB 20 mg premix (has no administration in time range)  alum & mag hydroxide-simeth (MAALOX/MYLANTA) 200-200-20 MG/5ML suspension 15 mL (has no administration in time range)  ondansetron (ZOFRAN) injection 4 mg (has no administration in time range)  ondansetron (ZOFRAN-ODT) disintegrating tablet 4 mg (4 mg Oral Given 05/25/22 2123)  lactated ringers bolus 1,000 mL (0 mLs Intravenous Stopped 05/25/22 2321)  acetaminophen (TYLENOL) tablet 1,000 mg (1,000 mg Oral Given 05/25/22 2319)  ketorolac (TORADOL) 15 MG/ML injection 15 mg (15 mg Intravenous Given 05/25/22 2319)    ED Course/ Medical Decision Making/ A&P                           Medical Decision Making Amount and/or Complexity of Data Reviewed Labs: ordered.  Risk OTC drugs. Prescription drug management.   11:58 PM Differential diagnosis includes but is not limited to alcoholic gastritis, pancreatitis, gallbladder pathology, and other abdominal pathologies included but not limited to ectopic pregnancy, bowel obstruction, etc.   30 year old female presenting presenting for epigastric abdominal pain.  Patient is alert and oriented x3, no acute distress, afebrile, stable vital signs.  Physical exam demonstrates soft abdomen with minimal tenderness palpation of the epigastric region.  No guarding or rigidity.  Patient received a liter of IV fluids and Zofran prior to arrival.  She admits to significant improvement of  symptoms at this time with no vomiting since receiving Zofran.  Laboratory studies and as interpreted by myself demonstrates no leukocytosis.  No signs or symptoms of sepsis.  Stable liver profile, bilirubins, lipase, and renal function.  Doubt pancreatitis.  No right upper quadrant tenderness on exam.  Doubt gallbladder pathology.  Serum pregnancy negative.  We will repeat an additional fluid bolus, Pepcid, Maalox, and Zofran.  Patient symptoms likely secondary to alcoholic gastritis.  Symptoms completely resolved at this time I see no indication for further imaging.  On reevaluation patient admits to continued resolution of symptoms.  Safe for discharge at this time.  Recommended to return promptly for any worsening symptoms.  Patient in no distress and overall condition improved here in the ED. Detailed discussions were had with  the patient regarding current findings, and need for close f/u with PCP or on call doctor. The patient has been instructed to return immediately if the symptoms worsen in any way for re-evaluation. Patient verbalized understanding and is in agreement with current care plan. All questions answered prior to discharge.   {Document critical care time when appropriate:1} {Document review of labs and clinical decision tools ie heart score, Chads2Vasc2 etc:1}  {Document your independent review of radiology images, and any outside records:1} {Document your discussion with family members, caretakers, and with consultants:1} {Document social determinants of health affecting pt's care:1} {Document your decision making why or why not admission, treatments were needed:1} Final Clinical Impression(s) / ED Diagnoses Final diagnoses:  None    Rx / DC Orders ED Discharge Orders     None

## 2022-05-26 MED ORDER — FAMOTIDINE 20 MG PO TABS: 20.0000 mg | ORAL_TABLET | Freq: Two times a day (BID) | ORAL | 0 refills | Status: AC

## 2022-05-26 MED ORDER — ONDANSETRON HCL 4 MG PO TABS: 4.0000 mg | ORAL_TABLET | Freq: Four times a day (QID) | ORAL | 0 refills | Status: DC | PRN

## 2022-05-26 MED ORDER — MAALOX MAX 400-400-40 MG/5ML PO SUSP: 10.0000 mL | Freq: Four times a day (QID) | ORAL | 0 refills | Status: AC | PRN

## 2022-05-26 NOTE — Discharge Instructions (Signed)
Medication sent to your pharmacy to treat gastritis.  No signs of pancreatitis on blood work today.    Please follow-up with your primary care physician or the wellness center provided if symptoms not improve in the next 3 to 5 days for reevaluation.

## 2024-07-01 ENCOUNTER — Emergency Department (HOSPITAL_COMMUNITY)
Admission: EM | Admit: 2024-07-01 | Discharge: 2024-07-01 | Disposition: A | Discharge status: Home / Self Care | Payer: MEDICAID | Attending: Emergency Medicine | Admitting: Emergency Medicine

## 2024-07-01 ENCOUNTER — Encounter (HOSPITAL_COMMUNITY): Payer: Self-pay

## 2024-07-01 ENCOUNTER — Other Ambulatory Visit: Payer: Self-pay

## 2024-07-01 DIAGNOSIS — J101 Influenza due to other identified influenza virus with other respiratory manifestations: Secondary | ICD-10-CM | POA: Diagnosis not present

## 2024-07-01 DIAGNOSIS — R52 Pain, unspecified: Secondary | ICD-10-CM | POA: Diagnosis present

## 2024-07-01 DIAGNOSIS — J111 Influenza due to unidentified influenza virus with other respiratory manifestations: Secondary | ICD-10-CM

## 2024-07-01 LAB — RESP PANEL BY RT-PCR (RSV, FLU A&B, COVID)  RVPGX2
Influenza A by PCR: POSITIVE — AB
Influenza B by PCR: NEGATIVE
Resp Syncytial Virus by PCR: NEGATIVE
SARS Coronavirus 2 by RT PCR: NEGATIVE

## 2024-07-01 MED ORDER — ONDANSETRON HCL 4 MG PO TABS: 4.0000 mg | ORAL_TABLET | Freq: Four times a day (QID) | ORAL | 0 refills | Status: AC

## 2024-07-01 MED ORDER — IBUPROFEN 800 MG PO TABS
800.0000 mg | ORAL_TABLET | Freq: Once | ORAL | Status: AC
  Administered 2024-07-01: 800 mg via ORAL
  Filled 2024-07-01: qty 1

## 2024-07-01 MED ORDER — ONDANSETRON 4 MG PO TBDP
4.0000 mg | ORAL_TABLET | Freq: Once | ORAL | Status: AC
  Administered 2024-07-01: 4 mg via ORAL
  Filled 2024-07-01: qty 1

## 2024-07-01 NOTE — Discharge Instructions (Addendum)
 You tested positive for the flu.  You may take Motrin  and Tylenol  at home.  I have sent your prescription for Zofran  which is a nausea medicine.  You may take 4 mg every 8 hours.  If you are giving this medicine to your children, they may take 2 mg or half a pill every 8 hours.

## 2024-07-01 NOTE — ED Triage Notes (Signed)
 Mom says her and family have been feeling ill. Intermittent fevers treating with OTC tylenol .   Also says she fell down some stairs 4 days ago and has a lingering headache.

## 2024-07-01 NOTE — ED Provider Notes (Signed)
 " [place] EMERGENCY DEPARTMENT AT [facility] Provider Note   CSN: [id number] Arrival date & time: 07/01/24  9561     Patient presents with: Generalized Body Aches   Tammy Torres is a 33 y.o. female.   33 year old female here today for myalgias and nausea.        Prior to Admission medications  Medication Sig Start Date End Date Taking? Authorizing Provider  albuterol  (PROAIR  HFA) 108 (90 BASE) MCG/ACT inhaler Inhale 2 puffs into the lungs every 4 (four) hours as needed for wheezing or shortness of breath. 2 puffs every 4 hours as needed only  if your can't catch your breath 05/31/13   Darlean Ozell NOVAK, MD  alum & mag hydroxide-simeth (MAALOX MAX) 400-400-40 MG/5ML suspension Take 10 mLs by mouth every 6 (six) hours as needed for indigestion. 05/26/22   Elnor Hila P, DO  famotidine  (PEPCID ) 20 MG tablet Take 1 tablet (20 mg total) by mouth 2 (two) times daily for 5 days. 05/26/22 05/31/22  Elnor Hila SQUIBB, DO  ferrous sulfate  325 (65 FE) MG tablet Take 1 tablet (325 mg total) by mouth 2 (two) times daily with a meal. 01/28/19   Lilton Legions, DO  Multiple Vitamin (ONE-A-DAY ESSENTIAL) TABS Take by mouth daily.    [provider]  ondansetron  (ZOFRAN ) 4 MG tablet Take 1 tablet (4 mg total) by mouth every 6 (six) hours as needed for nausea or vomiting. 05/26/22   Elnor Hila SQUIBB, DO    Allergies: Patient has no known allergies.    Review of Systems  Updated Vital Signs BP (!) 147/83 (BP Location: Right Arm)   Pulse (!) 109   Temp 98.4 F (36.9 C) (Oral)   Resp 16   Ht 5' 5 (1.651 m)   Wt 61.2 kg   SpO2 100%   BMI 22.47 kg/m   Physical Exam Vitals and nursing note reviewed.  Constitutional:      Appearance: She is not toxic-appearing.  HENT:     Head: Normocephalic and atraumatic.     Right Ear: External ear normal.     Left Ear: External ear normal.     Nose: Nose normal.     Mouth/Throat:     Mouth: Mucous membranes are moist.  Eyes:      Pupils: Pupils are equal, round, and reactive to light.  Cardiovascular:     Rate and Rhythm: Normal rate.  Pulmonary:     Effort: Pulmonary effort is normal.  Abdominal:     General: Abdomen is flat.     Palpations: Abdomen is soft.  Musculoskeletal:        General: Normal range of motion.     Cervical back: Normal range of motion and neck supple.  Skin:    General: Skin is warm.  Neurological:     General: No focal deficit present.     Mental Status: She is alert.     Cranial Nerves: No cranial nerve deficit.     Motor: No weakness.     Gait: Gait normal.     (all labs ordered are listed, but only abnormal results are displayed) Labs Reviewed  RESP PANEL BY RT-PCR (RSV, FLU A&B, COVID)  RVPGX2 - Abnormal; Notable for the following components:      Result Value   Influenza A by PCR POSITIVE (*)    All other components within normal limits    EKG: None  Radiology: No results found.   Procedures   Medications  Ordered in the ED  ibuprofen  (ADVIL ) tablet 800 mg (800 mg Oral Given 07/01/24 0616)  ondansetron  (ZOFRAN -ODT) disintegrating tablet 4 mg (4 mg Oral Given 07/01/24 0618)                                    Medical Decision Making 33 year old female here today with URI symptoms.  Plan -patient with symptoms consistent with influenza, which was positive on her viral swab.  There is a report of a fall a few days ago.  Patient without a reassuring physical exam, no neurologic deficits, do not believe she requires imaging of the head.  Will discharge.  She is here with 2 daughters with similar symptoms.  Risk Prescription drug management.        Final diagnoses:  Influenza    ED Discharge Orders     None          Mannie Fairy DASEN, DO 07/01/24 9380  "

## 2024-07-10 ENCOUNTER — Emergency Department (HOSPITAL_COMMUNITY): Payer: MEDICAID

## 2024-07-10 ENCOUNTER — Encounter (HOSPITAL_COMMUNITY): Payer: Self-pay

## 2024-07-10 ENCOUNTER — Emergency Department (HOSPITAL_COMMUNITY)
Admission: EM | Admit: 2024-07-10 | Discharge: 2024-07-10 | Disposition: A | Discharge status: Home / Self Care | Payer: MEDICAID | Attending: Emergency Medicine | Admitting: Emergency Medicine

## 2024-07-10 ENCOUNTER — Other Ambulatory Visit: Payer: Self-pay

## 2024-07-10 DIAGNOSIS — R519 Headache, unspecified: Secondary | ICD-10-CM | POA: Insufficient documentation

## 2024-07-10 DIAGNOSIS — W108XXA Fall (on) (from) other stairs and steps, initial encounter: Secondary | ICD-10-CM | POA: Insufficient documentation

## 2024-07-10 DIAGNOSIS — E876 Hypokalemia: Secondary | ICD-10-CM | POA: Diagnosis not present

## 2024-07-10 DIAGNOSIS — H538 Other visual disturbances: Secondary | ICD-10-CM | POA: Diagnosis not present

## 2024-07-10 DIAGNOSIS — Y92009 Unspecified place in unspecified non-institutional (private) residence as the place of occurrence of the external cause: Secondary | ICD-10-CM | POA: Insufficient documentation

## 2024-07-10 LAB — HCG, SERUM, QUALITATIVE: Preg, Serum: NEGATIVE

## 2024-07-10 LAB — CBC
HCT: 37.8 % (ref 36.0–46.0)
Hemoglobin: 12.2 g/dL (ref 12.0–15.0)
MCH: 27.1 pg (ref 26.0–34.0)
MCHC: 32.3 g/dL (ref 30.0–36.0)
MCV: 83.8 fL (ref 80.0–100.0)
Platelets: 252 K/uL (ref 150–400)
RBC: 4.51 MIL/uL (ref 3.87–5.11)
RDW: 13.9 % (ref 11.5–15.5)
WBC: 7.5 K/uL (ref 4.0–10.5)
nRBC: 0 % (ref 0.0–0.2)

## 2024-07-10 LAB — BASIC METABOLIC PANEL WITH GFR
Anion gap: 14 (ref 5–15)
BUN: 5 mg/dL — ABNORMAL LOW (ref 6–20)
CO2: 20 mmol/L — ABNORMAL LOW (ref 22–32)
Calcium: 9.3 mg/dL (ref 8.9–10.3)
Chloride: 104 mmol/L (ref 98–111)
Creatinine, Ser: 0.76 mg/dL (ref 0.44–1.00)
GFR, Estimated: >60 mL/min
Glucose, Bld: 103 mg/dL — ABNORMAL HIGH (ref 70–99)
Potassium: 3.4 mmol/L — ABNORMAL LOW (ref 3.5–5.1)
Sodium: 138 mmol/L (ref 135–145)

## 2024-07-10 MED ORDER — DIPHENHYDRAMINE HCL 25 MG PO CAPS
25.0000 mg | ORAL_CAPSULE | Freq: Once | ORAL | Status: AC
  Administered 2024-07-10: 25 mg via ORAL
  Filled 2024-07-10: qty 1

## 2024-07-10 MED ORDER — KETOROLAC TROMETHAMINE 15 MG/ML IJ SOLN
15.0000 mg | Freq: Once | INTRAMUSCULAR | Status: AC
  Administered 2024-07-10: 15 mg via INTRAMUSCULAR
  Filled 2024-07-10: qty 1

## 2024-07-10 MED ORDER — OXYCODONE-ACETAMINOPHEN 5-325 MG PO TABS
1.0000 | ORAL_TABLET | ORAL | Status: DC | PRN
  Administered 2024-07-10: 1 via ORAL
  Filled 2024-07-10: qty 1

## 2024-07-10 MED ORDER — PROCHLORPERAZINE MALEATE 5 MG PO TABS
10.0000 mg | ORAL_TABLET | Freq: Once | ORAL | Status: AC
  Administered 2024-07-10: 10 mg via ORAL
  Filled 2024-07-10: qty 2

## 2024-07-10 NOTE — ED Provider Triage Note (Signed)
 Emergency Medicine Provider Triage Evaluation Note  Roxanne Panek , a 33 y.o. female  was evaluated in triage.  Pt complains of headache.  Patient fell down 12 wooden steps in her home approximately 2 weeks ago, posterior headache has been ongoing since then, complains of occasional vision changes/eye twitching, none presently.  Unclear if she lost consciousness after the fall.  Review of Systems  Positive: As above Negative: As above  Physical Exam  BP (!) 135/103   Pulse (!) 106   Temp 98.2 F (36.8 C)   Resp 17   SpO2 100%  Gen:   Awake, no distress   Resp:  Normal effort  MSK:   Moves extremities without difficulty  Other:  No focal neurologic deficits  Medical Decision Making  Medically screening exam initiated at 1:20 PM.  Appropriate orders placed.  Raylynne Farve was informed that the remainder of the evaluation will be completed by another provider, this initial triage assessment does not replace that evaluation, and the importance of remaining in the ED until their evaluation is complete.     Glendia Rocky SAILOR, [place] 07/10/24 1321

## 2024-07-10 NOTE — ED Triage Notes (Signed)
 Pt c.o fall down several stairs about 2 weeks ago, since then she has had sever headaches, dizziness and bilateral eye twitching.

## 2024-07-10 NOTE — ED Provider Notes (Signed)
 " [place] EMERGENCY DEPARTMENT AT [facility] Provider Note   CSN: [id number] Arrival date & time: 07/10/24  1121     Patient presents with: Fall and Headache   Tammy Torres is a 33 y.o. female.   33 year old female presenting with headache.  Patient fell down 12 wooden steps in her home approximately 2 weeks ago, posterior headache has been ongoing since then, complains of occasional vision changes/eye twitching, none presently.  Unclear if she lost consciousness after the fall.  Denies dizziness, gait instability.  Was recently diagnosed with influenza A but reports that her symptoms have improved from this, it is just that her headache is persisting.      Fall Associated symptoms include headaches.  Headache      Prior to Admission medications  Medication Sig Start Date End Date Taking? Authorizing Provider  albuterol  (PROAIR  HFA) 108 (90 BASE) MCG/ACT inhaler Inhale 2 puffs into the lungs every 4 (four) hours as needed for wheezing or shortness of breath. 2 puffs every 4 hours as needed only  if your can't catch your breath 05/31/13   Darlean Ozell NOVAK, MD  alum & mag hydroxide-simeth (MAALOX MAX) 400-400-40 MG/5ML suspension Take 10 mLs by mouth every 6 (six) hours as needed for indigestion. 05/26/22   Elnor Bernarda SQUIBB, DO  famotidine  (PEPCID ) 20 MG tablet Take 1 tablet (20 mg total) by mouth 2 (two) times daily for 5 days. 05/26/22 05/31/22  Elnor Bernarda SQUIBB, DO  ferrous sulfate  325 (65 FE) MG tablet Take 1 tablet (325 mg total) by mouth 2 (two) times daily with a meal. 01/28/19   Lilton Legions, DO  Multiple Vitamin (ONE-A-DAY ESSENTIAL) TABS Take by mouth daily.    [provider]  ondansetron  (ZOFRAN ) 4 MG tablet Take 1 tablet (4 mg total) by mouth every 6 (six) hours. 07/01/24   Mannie Fairy DASEN, DO    Allergies: Patient has no known allergies.    Review of Systems  Neurological:  Positive for headaches.    Updated Vital Signs  Vitals:    07/10/24 1126 07/10/24 1536  BP: (!) 135/103 (!) 139/97  Pulse: (!) 106 77  Resp: 17 16  Temp: 98.2 F (36.8 C) 98.1 F (36.7 C)  SpO2: 100% 98%     Physical Exam Vitals and nursing note reviewed.  HENT:     Head: Normocephalic.  Eyes:     Extraocular Movements: Extraocular movements intact.     Pupils: Pupils are equal, round, and reactive to light.  Cardiovascular:     Rate and Rhythm: Normal rate.  Pulmonary:     Effort: Pulmonary effort is normal.  Musculoskeletal:     Cervical back: Normal range of motion and neck supple. No rigidity or tenderness.     Comments: Moves all extremities spontaneously without difficulty  Skin:    General: Skin is warm and dry.  Neurological:     General: No focal deficit present.     Mental Status: She is alert and oriented to person, place, and time.     Sensory: No sensory deficit.     Motor: No weakness.     Comments: Ambulates on her own without difficulty, no instability/ataxia     (all labs ordered are listed, but only abnormal results are displayed) Labs Reviewed  BASIC METABOLIC PANEL WITH GFR - Abnormal; Notable for the following components:      Result Value   Potassium 3.4 (*)    CO2 20 (*)  Glucose, Bld 103 (*)    BUN 5 (*)    All other components within normal limits  CBC  HCG, SERUM, QUALITATIVE    EKG: None  Radiology: CT Cervical Spine Wo Contrast Result Date: 07/10/2024 EXAM: CT CERVICAL SPINE WITHOUT CONTRAST 07/10/2024 01:52:45 PM TECHNIQUE: CT of the cervical spine was performed without the administration of intravenous contrast. Multiplanar reformatted images are provided for review. Automated exposure control, iterative reconstruction, and/or weight based adjustment of the mA/kV was utilized to reduce the radiation dose to as low as reasonably achievable. COMPARISON: None available. CLINICAL HISTORY: Headache/neck pain after fall downstairs FINDINGS: BONES AND ALIGNMENT: No acute fracture or traumatic  malalignment. DEGENERATIVE CHANGES: No significant degenerative changes. SOFT TISSUES: No prevertebral soft tissue swelling. IMPRESSION: 1. No acute fracture or traumatic malalignment of the cervical spine. Electronically signed by: Donnice Mania MD 07/10/2024 02:04 PM EST RP Workstation: [id number]   CT Head Wo Contrast Result Date: 07/10/2024 EXAM: CT HEAD WITHOUT CONTRAST 07/10/2024 01:52:45 PM TECHNIQUE: CT of the head was performed without the administration of intravenous contrast. Automated exposure control, iterative reconstruction, and/or weight based adjustment of the mA/kV was utilized to reduce the radiation dose to as low as reasonably achievable. COMPARISON: None available. CLINICAL HISTORY: Headache after fall down stairs. FINDINGS: BRAIN AND VENTRICLES: No acute hemorrhage. No evidence of acute infarct. No hydrocephalus. No extra-axial collection. No mass effect or midline shift. ORBITS: No acute abnormality. SINUSES: No acute abnormality. SOFT TISSUES AND SKULL: No acute soft tissue abnormality. No skull fracture. IMPRESSION: 1. No acute intracranial abnormality. Electronically signed by: Donnice Mania MD 07/10/2024 02:01 PM EST RP Workstation: [id number]     Procedures   Medications Ordered in the ED  oxyCODONE -acetaminophen  (PERCOCET/ROXICET) 5-325 MG per tablet 1 tablet (1 tablet Oral Given 07/10/24 1133)  ketorolac  (TORADOL ) 15 MG/ML injection 15 mg (15 mg Intramuscular Given 07/10/24 1827)  prochlorperazine  (COMPAZINE ) tablet 10 mg (10 mg Oral Given 07/10/24 1854)  diphenhydrAMINE  (BENADRYL ) capsule 25 mg (25 mg Oral Given 07/10/24 1854)                                    Medical Decision Making This patient presents to the ED for concern of headache, this involves an extensive number of treatment options, and is a complaint that carries with it a high risk of complications and morbidity.  The differential diagnosis includes intracranial hemorrhage, skull fracture, migraine,  tension headache, dehydration, sequela from influenza A   Co morbidities that complicate the patient evaluation  Chronic migraines   Additional history obtained:  Additional history obtained from record review External records from outside source obtained and reviewed including prior ED note   Lab Tests:  I Ordered, and personally interpreted labs.  The pertinent results include: CBC within normal limits.  BMP notable for borderline hypokalemia with potassium of 3.4, otherwise unremarkable.  Serum hCG negative.   Imaging Studies ordered:  I ordered imaging studies including CT head without contrast, CT C-spine I independently visualized and interpreted imaging which showed  - CT head: 1. No acute intracranial abnormality. - CT C-spine: 1. No acute fracture or traumatic malalignment of the cervical spine. I agree with the radiologist interpretation   Cardiac Monitoring: / EKG:  The patient was maintained on a cardiac monitor.  I personally viewed and interpreted the cardiac monitored which showed an underlying rhythm of: NSR   Problem List / ED Course /  Critical interventions / Medication management  I ordered medication including Percocet/Toradol /Compazine /Benadryl  for headache Reevaluation of the patient after these medicines showed that the patient improved I have reviewed the patients home medicines and have made adjustments as needed   Social Determinants of Health:  Former tobacco use   Test / Admission - Considered:  Physical exam unremarkable as above, no focal neurologic deficits on exam.  Patient notes a fall down the stairs several weeks ago, did not have a CT imaging after that fall.  Low suspicion for intracranial injury/skull fracture given the amount of time that has passed since patient's fall, however given the fact that she was not evaluated for this issue I do feel that CT imaging is warranted. CT results are unremarkable as above.  Patient given  Toradol /Compazine /Benadryl  and notes symptomatic improvement, also believes that her symptoms may be secondary to her recent flu diagnosis as well as dehydration/lack of p.o. intake today.  I recommend that she stay well-hydrated to avoid dehydration.  Patient has history of migraines and is on sumatriptan but has not been evaluated by a neurologist, I recommend that she discuss this with her PCP, if her headache symptoms persist she may benefit from a referral to a neurologist given the frequency of her migraines.  Patient voiced understanding and is in agreement this plan, she is appropriate for discharge at this time.  Amount and/or Complexity of Data Reviewed Labs: ordered. Radiology: ordered.  Risk Prescription drug management.        Final diagnoses:  Nonintractable headache, unspecified chronicity pattern, unspecified headache type    ED Discharge Orders     None          Glendia Rocky SAILOR, [place] 07/10/24 1945  "

## 2024-07-10 NOTE — Discharge Instructions (Signed)
 Continue sumatriptan as prescribed by your PCP, if your headache symptoms persist you may benefit from referral to a neurologist, please discuss this with your PCP at your follow-up visit.  Return to the emergency department if your symptoms worsen.
